# Patient Record
Sex: Female | Born: 2000 | Race: Black or African American | Hispanic: No | Marital: Single | Smoking: Never smoker
Health system: Southern US, Community
[De-identification: ages and names within clinical notes are randomized; demographics above are authoritative.]

## PROBLEM LIST (undated history)

## (undated) DIAGNOSIS — J45909 Unspecified asthma, uncomplicated: Secondary | ICD-10-CM

## (undated) HISTORY — PX: NO PAST SURGERIES: SHX2092

## (undated) HISTORY — DX: Unspecified asthma, uncomplicated: J45.909

---
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
## 2019-01-06 ENCOUNTER — Other Ambulatory Visit: Payer: Self-pay

## 2019-01-06 NOTE — Progress Notes (Deleted)
GYNECOLOGY  VISIT   HPI: 18 y.o.   Single Black or African American Not Hispanic or Latino  female   G0P0000 with No LMP recorded.   here for     GYNECOLOGIC HISTORY: No LMP recorded. Contraception:*** Menopausal hormone therapy: ***        OB History    Gravida  0   Para  0   Term  0   Preterm  0   AB  0   Living  0     SAB  0   TAB  0   Ectopic  0   Multiple  0   Live Births  0              Patient Active Problem List   Diagnosis Date Noted  . Asthma     Past Medical History:  Diagnosis Date  . Asthma     No past surgical history on file.  Current Outpatient Medications  Medication Sig Dispense Refill  . albuterol (VENTOLIN HFA) 108 (90 Base) MCG/ACT inhaler Inhale 1 puff into the lungs as needed.    Colleen Can. JUNEL FE 1/20 1-20 MG-MCG tablet TAKE 1 TABLET BY MOUTH EVERY DAY 28 tablet 0  . TRI-LEGEST FE 1-20/1-30/1-35 MG-MCG tablet Take 1 tablet by mouth daily.     No current facility-administered medications for this visit.      ALLERGIES: Patient has no allergy information on record.  Family History  Problem Relation Age of Onset  . Thyroid disease Mother   . Breast cancer Paternal Grandmother     Social History   Socioeconomic History  . Marital status: Single    Spouse name: Not on file  . Number of children: Not on file  . Years of education: Not on file  . Highest education level: Not on file  Occupational History  . Not on file  Social Needs  . Financial resource strain: Not on file  . Food insecurity    Worry: Not on file    Inability: Not on file  . Transportation needs    Medical: Not on file    Non-medical: Not on file  Tobacco Use  . Smoking status: Never Smoker  . Smokeless tobacco: Never Used  Substance and Sexual Activity  . Alcohol use: Never    Frequency: Never  . Drug use: Never  . Sexual activity: Not Currently    Birth control/protection: Pill  Lifestyle  . Physical activity    Days per week: Not on file     Minutes per session: Not on file  . Stress: Not on file  Relationships  . Social Musicianconnections    Talks on phone: Not on file    Gets together: Not on file    Attends religious service: Not on file    Active member of club or organization: Not on file    Attends meetings of clubs or organizations: Not on file    Relationship status: Not on file  . Intimate partner violence    Fear of current or ex partner: Not on file    Emotionally abused: Not on file    Physically abused: Not on file    Forced sexual activity: Not on file  Other Topics Concern  . Not on file  Social History Narrative  . Not on file    ROS  PHYSICAL EXAMINATION:    There were no vitals taken for this visit.    General appearance: alert, cooperative and appears stated age  Neck: no adenopathy, supple, symmetrical, trachea midline and thyroid {CHL AMB PHY EX THYROID NORM DEFAULT:[phone]::"normal to inspection and palpation"} Breasts: {Exam; breast:13139::"normal appearance, no masses or tenderness"} Abdomen: soft, non-tender; non distended, no masses,  no organomegaly  Pelvic: External genitalia:  no lesions              Urethra:  normal appearing urethra with no masses, tenderness or lesions              Bartholins and Skenes: normal                 Vagina: normal appearing vagina with normal color and discharge, no lesions              Cervix: {CHL AMB PHY EX CERVIX NORM DEFAULT:[phone]::"no lesions"}              Bimanual Exam:  Uterus:  {CHL AMB PHY EX UTERUS NORM DEFAULT:[phone]::"normal size, contour, position, consistency, mobility, non-tender"}              Adnexa: {CHL AMB PHY EX ADNEXA NO MASS [phone]::"no mass, fullness, tenderness"}              Rectovaginal: {yes no:[id number]}.  Confirms.              Anus:  normal sphincter tone, no lesions  Chaperone was present for exam.  ASSESSMENT     PLAN    An After Visit Summary was printed and given to the patient.  ***  minutes face to face time of which over 50% was spent in counseling.

## 2019-01-06 NOTE — Progress Notes (Signed)
GYNECOLOGY  VISIT   HPI: 18 y.o.   Single Black or African American Not Hispanic or Latino  female   G0P0000 with Patient's last menstrual period was 12/24/2018.   here for 3 month OCP check. She was started on OCP's in 5/20 at her annual exam for cycle control. She had postpill amenorrhea previously. On the pill her cycle is monthly x 5-7 days, changes a pad in up to 1.5-2 hours (not saturated). Cramps can be bad, she uses a heating pad, she sometime uses OTC medication which helps some.    GYNECOLOGIC HISTORY: Patient's last menstrual period was 12/24/2018. Contraception:JUNEL FE Menopausal hormone therapy: n/a        OB History    Gravida  0   Para  0   Term  0   Preterm  0   AB  0   Living  0     SAB  0   TAB  0   Ectopic  0   Multiple  0   Live Births  0              Patient Active Problem List   Diagnosis Date Noted  . Asthma     Past Medical History:  Diagnosis Date  . Asthma     History reviewed. No pertinent surgical history.  Current Outpatient Medications  Medication Sig Dispense Refill  . Adapalene-Benzoyl Peroxide (EPIDUO FORTE) 0.3-2.5 % GEL Epiduo Forte 0.3 %-2.5 % topical gel with pump    . albuterol (VENTOLIN HFA) 108 (90 Base) MCG/ACT inhaler Inhale 1 puff into the lungs as needed.    Colleen Can. JUNEL FE 1/20 1-20 MG-MCG tablet TAKE 1 TABLET BY MOUTH EVERY DAY 28 tablet 0   No current facility-administered medications for this visit.      ALLERGIES: Patient has no known allergies.  Family History  Problem Relation Age of Onset  . Thyroid disease Mother   . Breast cancer Paternal Grandmother     Social History   Socioeconomic History  . Marital status: Single    Spouse name: Not on file  . Number of children: Not on file  . Years of education: Not on file  . Highest education level: Not on file  Occupational History  . Not on file  Social Needs  . Financial resource strain: Not on file  . Food insecurity    Worry: Not on file     Inability: Not on file  . Transportation needs    Medical: Not on file    Non-medical: Not on file  Tobacco Use  . Smoking status: Never Smoker  . Smokeless tobacco: Never Used  Substance and Sexual Activity  . Alcohol use: Never    Frequency: Never  . Drug use: Never  . Sexual activity: Not Currently    Birth control/protection: Pill  Lifestyle  . Physical activity    Days per week: Not on file    Minutes per session: Not on file  . Stress: Not on file  Relationships  . Social Musicianconnections    Talks on phone: Not on file    Gets together: Not on file    Attends religious service: Not on file    Active member of club or organization: Not on file    Attends meetings of clubs or organizations: Not on file    Relationship status: Not on file  . Intimate partner violence    Fear of current or ex partner: Not on file  Emotionally abused: Not on file    Physically abused: Not on file    Forced sexual activity: Not on file  Other Topics Concern  . Not on file  Social History Narrative  . Not on file    Review of Systems  Constitutional: Negative.   HENT: Negative.   Eyes: Negative.   Respiratory: Negative.   Cardiovascular: Negative.   Gastrointestinal: Negative.   Genitourinary: Negative.   Musculoskeletal: Negative.   Skin: Negative.   Neurological: Negative.   Endo/Heme/Allergies: Negative.   Psychiatric/Behavioral: Negative.     PHYSICAL EXAMINATION:    BP 112/70 (BP Location: Right Arm, Patient Position: Sitting, Cuff Size: Large)   Pulse 76   Temp (!) 97.2 F (36.2 C) (Temporal)   Resp 14   Wt 230 lb (104.3 kg)   LMP 12/24/2018   BMI 34.86 kg/m     General appearance: alert, cooperative and appears stated age  ASSESSMENT Contraception surveillance, doing well on this pill H/O post pill amenorrhea earlier this year, long h/o oligomenorrhea Dysmenorrhea      PLAN Continue OCP's Blood work was supposed to be done at her annual, but wasn't  done TSH/prolactin, screening labs, blood work for STD testing (negative cervical cultures) Declines prescription Ibuprofen, will use OTC medication as needed F/U for annual exam in 6/21   An After Visit Summary was printed and given to the patient.

## 2019-01-08 ENCOUNTER — Ambulatory Visit: Payer: 59 | Admitting: Obstetrics and Gynecology

## 2019-01-08 ENCOUNTER — Other Ambulatory Visit: Payer: Self-pay

## 2019-01-08 ENCOUNTER — Encounter: Payer: Self-pay | Admitting: Obstetrics and Gynecology

## 2019-01-08 VITALS — BP 112/70 | HR 76 | Temp 97.2°F | Resp 14 | Wt 230.0 lb

## 2019-01-08 DIAGNOSIS — Z8742 Personal history of other diseases of the female genital tract: Secondary | ICD-10-CM | POA: Diagnosis not present

## 2019-01-08 DIAGNOSIS — Z0000 Encounter for general adult medical examination without abnormal findings: Secondary | ICD-10-CM

## 2019-01-08 DIAGNOSIS — Z3041 Encounter for surveillance of contraceptive pills: Secondary | ICD-10-CM | POA: Diagnosis not present

## 2019-01-08 DIAGNOSIS — Z113 Encounter for screening for infections with a predominantly sexual mode of transmission: Secondary | ICD-10-CM | POA: Diagnosis not present

## 2019-01-08 DIAGNOSIS — N946 Dysmenorrhea, unspecified: Secondary | ICD-10-CM

## 2019-01-08 MED ORDER — NORETHIN ACE-ETH ESTRAD-FE 1-20 MG-MCG PO TABS: 1.0000 | ORAL_TABLET | Freq: Every day | ORAL | 2 refills | Status: DC

## 2019-01-09 LAB — COMPREHENSIVE METABOLIC PANEL
ALT: 12 IU/L (ref 0–32)
AST: 10 IU/L (ref 0–40)
Albumin/Globulin Ratio: 1.6 (ref 1.2–2.2)
Albumin: 3.9 g/dL (ref 3.9–5.0)
Alkaline Phosphatase: 44 IU/L (ref 39–117)
BUN/Creatinine Ratio: 20 (ref 9–23)
BUN: 13 mg/dL (ref 6–20)
Bilirubin Total: 0.2 mg/dL (ref 0.0–1.2)
CO2: 23 mmol/L (ref 20–29)
Calcium: 9 mg/dL (ref 8.7–10.2)
Chloride: 104 mmol/L (ref 96–106)
Creatinine, Ser: 0.66 mg/dL (ref 0.57–1.00)
GFR calc Af Amer: 148 mL/min/{1.73_m2} (ref >59)
GFR calc non Af Amer: 128 mL/min/{1.73_m2} (ref >59)
Globulin, Total: 2.5 g/dL (ref 1.5–4.5)
Glucose: 79 mg/dL (ref 65–99)
Potassium: 4.1 mmol/L (ref 3.5–5.2)
Sodium: 141 mmol/L (ref 134–144)
Total Protein: 6.4 g/dL (ref 6.0–8.5)

## 2019-01-09 LAB — CBC
Hematocrit: 34.4 % (ref 34.0–46.6)
Hemoglobin: 11 g/dL — ABNORMAL LOW (ref 11.1–15.9)
MCH: 29.6 pg (ref 26.6–33.0)
MCHC: 32 g/dL (ref 31.5–35.7)
MCV: 93 fL (ref 79–97)
Platelets: 220 10*3/uL (ref 150–450)
RBC: 3.72 x10E6/uL — ABNORMAL LOW (ref 3.77–5.28)
RDW: 12.1 % (ref 11.7–15.4)
WBC: 3.8 10*3/uL (ref 3.4–10.8)

## 2019-01-09 LAB — HIV ANTIBODY (ROUTINE TESTING W REFLEX): HIV Screen 4th Generation wRfx: NONREACTIVE

## 2019-01-09 LAB — PROLACTIN: Prolactin: 12.1 ng/mL (ref 4.8–23.3)

## 2019-01-09 LAB — RPR: RPR Ser Ql: NONREACTIVE

## 2019-01-09 LAB — LIPID PANEL
Chol/HDL Ratio: 2.3 ratio (ref 0.0–4.4)
Cholesterol, Total: 147 mg/dL (ref 100–169)
HDL: 64 mg/dL (ref >39)
LDL Calculated: 76 mg/dL (ref 0–109)
Triglycerides: 33 mg/dL (ref 0–89)
VLDL Cholesterol Cal: 7 mg/dL (ref 5–40)

## 2019-01-09 LAB — TSH: TSH: 2.24 u[IU]/mL (ref 0.450–4.500)

## 2019-01-12 ENCOUNTER — Other Ambulatory Visit: Payer: Self-pay | Admitting: Obstetrics and Gynecology

## 2019-01-12 DIAGNOSIS — Z862 Personal history of diseases of the blood and blood-forming organs and certain disorders involving the immune mechanism: Secondary | ICD-10-CM

## 2019-03-20 ENCOUNTER — Other Ambulatory Visit: Payer: Self-pay | Admitting: Family Medicine

## 2019-03-20 DIAGNOSIS — N6310 Unspecified lump in the right breast, unspecified quadrant: Secondary | ICD-10-CM

## 2019-03-25 ENCOUNTER — Other Ambulatory Visit: Payer: 59

## 2019-03-26 ENCOUNTER — Ambulatory Visit
Admission: RE | Admit: 2019-03-26 | Discharge: 2019-03-26 | Disposition: A | Discharge status: Home / Self Care | Payer: 59 | Source: Ambulatory Visit | Attending: Family Medicine | Admitting: Family Medicine

## 2019-03-26 ENCOUNTER — Other Ambulatory Visit: Payer: Self-pay

## 2019-03-26 DIAGNOSIS — N6310 Unspecified lump in the right breast, unspecified quadrant: Secondary | ICD-10-CM

## 2019-04-13 NOTE — Progress Notes (Deleted)
Patient in today for 3rd Gardasil injection.   Contraception: Depo LMP: *** Last AEX: 10/08/18 with Dr. Talbert Nan  Injection given in Right Deltoid. Patient tolerated shot well.   Patient informed she is finished with the series.

## 2019-04-16 ENCOUNTER — Ambulatory Visit: Payer: 59

## 2019-04-20 ENCOUNTER — Ambulatory Visit: Payer: 59

## 2019-04-20 NOTE — Progress Notes (Deleted)
Patient in today for *** Gardasil injection.   Contraception: *** LMP: *** Last AEX: *** with ***  Injection given in ***. Patient tolerated shot well.   Patient informed next injection due in about *** months.  Advised patient, if not on birth control, to return for next injection with cycle.   Routed to provider for final review.  Encounter closed.

## 2019-10-02 ENCOUNTER — Other Ambulatory Visit: Payer: Self-pay | Admitting: Obstetrics and Gynecology

## 2019-10-02 NOTE — Telephone Encounter (Signed)
Medication refill request: Junel  Last AEX:  10-08-2018 JJ  Next AEX: 10-14-19  Last MMG (if hormonal medication request): n/a Refill authorized: Today, please advise.   Medication pended for #28, 0RF. Please refill if appropriate.

## 2019-10-08 NOTE — Progress Notes (Signed)
19 y.o. G0P0000 Single Black or African American Not Hispanic or Latino female here for annual exam.      She was started on OCP's in 5/20 for cycle control. H/O oligomenorrhea (normal TSH/prolactin). Cycles are monthly x 5-6 days. Can saturate a pad in up to an 1.5 hours at times. Heavy for a couple of days.   Sexually active, new partner x 2 months. Not using condoms, no dyspareunia.   She was mildly anemic last year with a hgb of 11.  Patient's last menstrual period was 09/28/2019.          Sexually active: Yes.    The current method of family planning is JUNEL FE.    Exercising: No.  The patient does not participate in regular exercise at present. Smoker:  no  Health Maintenance: Pap:  N/A History of abnormal Pap:  NA TDaP:  unsure Gardasil: completed x2    reports that she has never smoked. She has never used smokeless tobacco. She reports that she does not drink alcohol or use drugs. She just finished her second year at [employer]. She is studying business. She has 4 older brothers.   Past Medical History:  Diagnosis Date  . Asthma     History reviewed. No pertinent surgical history.  Current Outpatient Medications  Medication Sig Dispense Refill  . albuterol (VENTOLIN HFA) 108 (90 Base) MCG/ACT inhaler Inhale 1 puff into the lungs as needed.    Lenda Kelp FE 1/20 1-20 MG-MCG tablet TAKE 1 TABLET BY MOUTH EVERY DAY 28 tablet 0   No current facility-administered medications for this visit.    Family History  Problem Relation Age of Onset  . Thyroid disease Mother   . Breast cancer Paternal Grandmother     Review of Systems  Constitutional: Negative.   HENT: Negative.   Eyes: Negative.   Respiratory: Negative.   Cardiovascular: Negative.   Gastrointestinal: Negative.   Endocrine: Negative.   Genitourinary: Negative.   Musculoskeletal: Negative.   Skin: Negative.   Allergic/Immunologic: Negative.   Neurological: Negative.   Hematological: Negative.    Psychiatric/Behavioral: Negative.     Exam:   BP (!) 110/58 (BP Location: Right Arm, Patient Position: Sitting, Cuff Size: Large)   Pulse 68   Temp (!) 97.3 F (36.3 C) (Temporal)   Ht 5' 8.25" (1.734 m)   Wt 211 lb (95.7 kg)   LMP 09/28/2019   BMI 31.85 kg/m   Weight change: @WEIGHTCHANGE @ Height:   Height: 5' 8.25" (173.4 cm)  Ht Readings from Last 3 Encounters:  10/14/19 5' 8.25" (1.734 m) (94 %, Z= 1.55)*  10/08/18 5' 8.11" (1.73 m) (93 %, Z= 1.51)*   * Growth percentiles are based on CDC (Girls, 2-20 Years) data.    General appearance: alert, cooperative and appears stated age Head: Normocephalic, without obvious abnormality, atraumatic Neck: no adenopathy, supple, symmetrical, trachea midline and thyroid normal to inspection and palpation Lungs: clear to auscultation bilaterally Cardiovascular: regular rate and rhythm Breasts: normal appearance, no masses or tenderness Abdomen: soft, non-tender; non distended,  no masses,  no organomegaly Extremities: extremities normal, atraumatic, no cyanosis or edema Skin: Skin color, texture, turgor normal. No rashes or lesions Lymph nodes: Cervical, supraclavicular, and axillary nodes normal. No abnormal inguinal nodes palpated Neurologic: Grossly normal   Pelvic: External genitalia:  no lesions              Urethra:  normal appearing urethra with no masses, tenderness or lesions  Bartholins and Skenes: normal                 Vagina: normal appearing vagina with normal color and discharge, no lesions              Cervix: no lesions               Bimanual Exam:  Uterus:  normal size, contour, position, consistency, mobility, non-tender and anteverted              Adnexa: no mass, fullness, tenderness               Rectovaginal: deferred  Carolynn Serve chaperoned for the exam.  A:  Well Woman with normal exam  Menorrhagia on OCP's, normal exam  H/O anemia  H/O asthma (rare), refill for albuterol sent  P:   No  pap until 20  Screening labs, ferritin  Screening STD's  3rd gardasil today  Continue OCP's  Condom use encouraged  If she is anemic, I recommended she return for an ultrasound to r/o a small myoma

## 2019-10-13 ENCOUNTER — Other Ambulatory Visit: Payer: Self-pay

## 2019-10-14 ENCOUNTER — Encounter: Payer: Self-pay | Admitting: Obstetrics and Gynecology

## 2019-10-14 ENCOUNTER — Ambulatory Visit: Payer: No Typology Code available for payment source | Admitting: Obstetrics and Gynecology

## 2019-10-14 VITALS — BP 110/58 | HR 68 | Temp 97.3°F | Ht 68.25 in | Wt 211.0 lb

## 2019-10-14 DIAGNOSIS — Z23 Encounter for immunization: Secondary | ICD-10-CM

## 2019-10-14 DIAGNOSIS — Z0000 Encounter for general adult medical examination without abnormal findings: Secondary | ICD-10-CM | POA: Diagnosis not present

## 2019-10-14 DIAGNOSIS — Z01419 Encounter for gynecological examination (general) (routine) without abnormal findings: Secondary | ICD-10-CM | POA: Diagnosis not present

## 2019-10-14 DIAGNOSIS — Z862 Personal history of diseases of the blood and blood-forming organs and certain disorders involving the immune mechanism: Secondary | ICD-10-CM | POA: Diagnosis not present

## 2019-10-14 DIAGNOSIS — Z113 Encounter for screening for infections with a predominantly sexual mode of transmission: Secondary | ICD-10-CM

## 2019-10-14 DIAGNOSIS — Z3041 Encounter for surveillance of contraceptive pills: Secondary | ICD-10-CM

## 2019-10-14 MED ORDER — ALBUTEROL SULFATE HFA 108 (90 BASE) MCG/ACT IN AERS: 1.0000 | INHALATION_SPRAY | RESPIRATORY_TRACT | 1 refills | Status: DC | PRN

## 2019-10-14 MED ORDER — NORETHIN ACE-ETH ESTRAD-FE 1-20 MG-MCG PO TABS: 1.0000 | ORAL_TABLET | Freq: Every day | ORAL | 3 refills | Status: DC

## 2019-10-14 NOTE — Patient Instructions (Signed)
EXERCISE AND DIET:  We recommended that you start or continue a regular exercise program for good health. Regular exercise means any activity that makes your heart beat faster and makes you sweat.  We recommend exercising at least 30 minutes per day at least 3 days a week, preferably 4 or 5.  We also recommend a diet low in fat and sugar.  Inactivity, poor dietary choices and obesity can cause diabetes, heart attack, stroke, and kidney damage, among others.    ALCOHOL AND SMOKING:  Women should limit their alcohol intake to no more than 7 drinks/beers/glasses of wine (combined, not each!) per week. Moderation of alcohol intake to this level decreases your risk of breast cancer and liver damage. And of course, no recreational drugs are part of a healthy lifestyle.  And absolutely no smoking or even second hand smoke. Most people know smoking can cause heart and lung diseases, but did you know it also contributes to weakening of your bones? Aging of your skin?  Yellowing of your teeth and nails?  CALCIUM AND VITAMIN D:  Adequate intake of calcium and Vitamin D are recommended.  The recommendations for exact amounts of these supplements seem to change often, but generally speaking 1,000 mg of calcium (between diet and supplement) and 800 units of Vitamin D per day seems prudent. Certain women may benefit from higher intake of Vitamin D.  If you are among these women, your doctor will have told you during your visit.    PAP SMEARS:  Pap smears, to check for cervical cancer or precancers,  have traditionally been done yearly, although recent scientific advances have shown that most women can have pap smears less often.  However, every woman still should have a physical exam from her gynecologist every year. It will include a breast check, inspection of the vulva and vagina to check for abnormal growths or skin changes, a visual exam of the cervix, and then an exam to evaluate the size and shape of the uterus and  ovaries.  And after 20 years of age, a rectal exam is indicated to check for rectal cancers. We will also provide age appropriate advice regarding health maintenance, like when you should have certain vaccines, screening for sexually transmitted diseases, bone density testing, colonoscopy, mammograms, etc.   MAMMOGRAMS:  All women over 40 years old should have a yearly mammogram. Many facilities now offer a "3D" mammogram, which may cost around $50 extra out of pocket. If possible,  we recommend you accept the option to have the 3D mammogram performed.  It both reduces the number of women who will be called back for extra views which then turn out to be normal, and it is better than the routine mammogram at detecting truly abnormal areas.    COLON CANCER SCREENING: Now recommend starting at age 45. At this time colonoscopy is not covered for routine screening until 50. There are take home tests that can be done between 45-49.   COLONOSCOPY:  Colonoscopy to screen for colon cancer is recommended for all women at age 50.  We know, you hate the idea of the prep.  We agree, BUT, having colon cancer and not knowing it is worse!!  Colon cancer so often starts as a polyp that can be seen and removed at colonscopy, which can quite literally save your life!  And if your first colonoscopy is normal and you have no family history of colon cancer, most women don't have to have it again for   10 years.  Once every ten years, you can do something that may end up saving your life, right?  We will be happy to help you get it scheduled when you are ready.  Be sure to check your insurance coverage so you understand how much it will cost.  It may be covered as a preventative service at no cost, but you should check your particular policy.      Breast Self-Awareness Breast self-awareness means being familiar with how your breasts look and feel. It involves checking your breasts regularly and reporting any changes to your  health care provider. Practicing breast self-awareness is important. A change in your breasts can be a sign of a serious medical problem. Being familiar with how your breasts look and feel allows you to find any problems early, when treatment is more likely to be successful. All women should practice breast self-awareness, including women who have had breast implants. How to do a breast self-exam One way to learn what is normal for your breasts and whether your breasts are changing is to do a breast self-exam. To do a breast self-exam: Look for Changes  1. Remove all the clothing above your waist. 2. Stand in front of a mirror in a room with good lighting. 3. Put your hands on your hips. 4. Push your hands firmly downward. 5. Compare your breasts in the mirror. Look for differences between them (asymmetry), such as: ? Differences in shape. ? Differences in size. ? Puckers, dips, and bumps in one breast and not the other. 6. Look at each breast for changes in your skin, such as: ? Redness. ? Scaly areas. 7. Look for changes in your nipples, such as: ? Discharge. ? Bleeding. ? Dimpling. ? Redness. ? A change in position. Feel for Changes Carefully feel your breasts for lumps and changes. It is best to do this while lying on your back on the floor and again while sitting or standing in the shower or tub with soapy water on your skin. Feel each breast in the following way:  Place the arm on the side of the breast you are examining above your head.  Feel your breast with the other hand.  Start in the nipple area and make  inch (2 cm) overlapping circles to feel your breast. Use the pads of your three middle fingers to do this. Apply light pressure, then medium pressure, then firm pressure. The light pressure will allow you to feel the tissue closest to the skin. The medium pressure will allow you to feel the tissue that is a little deeper. The firm pressure will allow you to feel the tissue  close to the ribs.  Continue the overlapping circles, moving downward over the breast until you feel your ribs below your breast.  Move one finger-width toward the center of the body. Continue to use the  inch (2 cm) overlapping circles to feel your breast as you move slowly up toward your collarbone.  Continue the up and down exam using all three pressures until you reach your armpit.  Write Down What You Find  Write down what is normal for each breast and any changes that you find. Keep a written record with breast changes or normal findings for each breast. By writing this information down, you do not need to depend only on memory for size, tenderness, or location. Write down where you are in your menstrual cycle, if you are still menstruating. If you are having trouble noticing differences   in your breasts, do not get discouraged. With time you will become more familiar with the variations in your breasts and more comfortable with the exam. How often should I examine my breasts? Examine your breasts every month. If you are breastfeeding, the best time to examine your breasts is after a feeding or after using a breast pump. If you menstruate, the best time to examine your breasts is 5-7 days after your period is over. During your period, your breasts are lumpier, and it may be more difficult to notice changes. When should I see my health care provider? See your health care provider if you notice:  A change in shape or size of your breasts or nipples.  A change in the skin of your breast or nipples, such as a reddened or scaly area.  Unusual discharge from your nipples.  A lump or thick area that was not there before.  Pain in your breasts.  Anything that concerns you.  

## 2019-10-15 LAB — COMPREHENSIVE METABOLIC PANEL
ALT: 6 IU/L (ref 0–32)
AST: 9 IU/L (ref 0–40)
Albumin/Globulin Ratio: 1.6 (ref 1.2–2.2)
Albumin: 4 g/dL (ref 3.9–5.0)
Alkaline Phosphatase: 51 IU/L (ref 45–106)
BUN/Creatinine Ratio: 19 (ref 9–23)
BUN: 12 mg/dL (ref 6–20)
Bilirubin Total: 0.3 mg/dL (ref 0.0–1.2)
CO2: 24 mmol/L (ref 20–29)
Calcium: 9.1 mg/dL (ref 8.7–10.2)
Chloride: 102 mmol/L (ref 96–106)
Creatinine, Ser: 0.62 mg/dL (ref 0.57–1.00)
GFR calc Af Amer: 151 mL/min/{1.73_m2} (ref >59)
GFR calc non Af Amer: 131 mL/min/{1.73_m2} (ref >59)
Globulin, Total: 2.5 g/dL (ref 1.5–4.5)
Glucose: 81 mg/dL (ref 65–99)
Potassium: 4 mmol/L (ref 3.5–5.2)
Sodium: 141 mmol/L (ref 134–144)
Total Protein: 6.5 g/dL (ref 6.0–8.5)

## 2019-10-15 LAB — CBC
Hematocrit: 37.4 % (ref 34.0–46.6)
Hemoglobin: 11.7 g/dL (ref 11.1–15.9)
MCH: 29 pg (ref 26.6–33.0)
MCHC: 31.3 g/dL — ABNORMAL LOW (ref 31.5–35.7)
MCV: 93 fL (ref 79–97)
Platelets: 261 10*3/uL (ref 150–450)
RBC: 4.04 x10E6/uL (ref 3.77–5.28)
RDW: 12.1 % (ref 11.7–15.4)
WBC: 3.7 10*3/uL (ref 3.4–10.8)

## 2019-10-15 LAB — LIPID PANEL
Chol/HDL Ratio: 2.6 ratio (ref 0.0–4.4)
Cholesterol, Total: 139 mg/dL (ref 100–169)
HDL: 54 mg/dL (ref >39)
LDL Chol Calc (NIH): 74 mg/dL (ref 0–109)
Triglycerides: 51 mg/dL (ref 0–89)
VLDL Cholesterol Cal: 11 mg/dL (ref 5–40)

## 2019-10-15 LAB — FERRITIN: Ferritin: 102 ng/mL — ABNORMAL HIGH (ref 15–77)

## 2019-10-15 LAB — RPR: RPR Ser Ql: NONREACTIVE

## 2019-10-15 LAB — HIV ANTIBODY (ROUTINE TESTING W REFLEX): HIV Screen 4th Generation wRfx: NONREACTIVE

## 2019-10-16 LAB — CHLAMYDIA/GONOCOCCUS/TRICHOMONAS, NAA
Chlamydia by NAA: NEGATIVE
Gonococcus by NAA: NEGATIVE
Trich vag by NAA: NEGATIVE

## 2019-10-25 ENCOUNTER — Other Ambulatory Visit: Payer: Self-pay | Admitting: Obstetrics and Gynecology

## 2019-10-26 ENCOUNTER — Telehealth: Payer: Self-pay | Admitting: Obstetrics and Gynecology

## 2019-10-26 NOTE — Telephone Encounter (Signed)
Patient requests refills of her birth control to [facility], [place]. She said the pharmacy told her Dr.Jertson denied the refill request. She said  "Dr.Jertson told me she was sending in refills at my last aex 10/14/19". She said "I was only given 1 refill to hold me over until my aex 10/14/19".

## 2019-10-26 NOTE — Telephone Encounter (Signed)
Spoke with patient and advised that prescription was indeed sent 10/14/19 to pharmacy. Advised patient that what was refused was a duplicate request. Patient states that she will go to pharmacy to pick up prescription. Patient verbalizes understanding and is agreeable. Closing encounter.

## 2020-02-02 ENCOUNTER — Telehealth: Payer: Self-pay

## 2020-02-02 NOTE — Telephone Encounter (Signed)
Patient states she is having cramping that may be due to birth control. Patient would like to come in for check.

## 2020-02-02 NOTE — Telephone Encounter (Signed)
AEX 10/2019 Started OCP 09/2018 for cycle control.   Spoke with pt. Pt states having pelvic/abd cramps, heavy bleeding and clots and slight nausea intermittently while taking OCPs during cycles. Pt states was concerned and saw PCP last week. States had negative STD testing and vaginitis testing. Denies vaginal discharge, odor and irritation. Pt states has only had these episodes the last 2 cycles. States is having monthly cycles, denies BTB.   Pt advised to have follow up with Dr Oscar La. Pt agreeable. No appts available this week on schedule.  Pt aware of work-in to schedule. Pt scheduled for 9/22 at 145 pm. Pt agreeable and verbalized understanding to date and time of appt.   Encounter closed.

## 2020-02-03 ENCOUNTER — Other Ambulatory Visit: Payer: Self-pay

## 2020-02-03 ENCOUNTER — Ambulatory Visit: Payer: No Typology Code available for payment source | Admitting: Obstetrics and Gynecology

## 2020-02-03 ENCOUNTER — Encounter: Payer: Self-pay | Admitting: Obstetrics and Gynecology

## 2020-02-03 VITALS — BP 110/62 | HR 101 | Ht 68.25 in | Wt 202.4 lb

## 2020-02-03 DIAGNOSIS — N946 Dysmenorrhea, unspecified: Secondary | ICD-10-CM | POA: Diagnosis not present

## 2020-02-03 DIAGNOSIS — R102 Pelvic and perineal pain: Secondary | ICD-10-CM

## 2020-02-03 DIAGNOSIS — K5900 Constipation, unspecified: Secondary | ICD-10-CM

## 2020-02-03 MED ORDER — NAPROXEN SODIUM 550 MG PO TABS
ORAL_TABLET | ORAL | 2 refills | Status: DC
Start: 2020-02-03 — End: 2020-11-26

## 2020-02-03 NOTE — Patient Instructions (Signed)
About Constipation  Constipation Overview Constipation is the most common gastrointestinal complaint -- about 4 million Americans experience constipation and make 2.5 million physician visits a year to get help for the problem.  Constipation can occur when the colon absorbs too much water, the colon's muscle contraction is slow or sluggish, and/or there is delayed transit time through the colon.  The result is stool that is hard and dry.  Indicators of constipation include straining during bowel movements greater than 25% of the time, having fewer than three bowel movements per week, and/or the feeling of incomplete evacuation.  There are established guidelines ([place] II ) for defining constipation. A person needs to have two or more of the following symptoms for at least 12 weeks (not necessarily consecutive) in the preceding 12 months: . Straining in  greater than 25% of bowel movements . Lumpy or hard stools in greater than 25% of bowel movements . Sensation of incomplete emptying in greater than 25% of bowel movements . Sensation of anorectal obstruction/blockade in greater than 25% of bowel movements . Manual maneuvers to help empty greater than 25% of bowel movements (e.g., digital evacuation, support of the pelvic floor)  . Less than  3 bowel movements/week . Loose stools are not present, and criteria for irritable bowel syndrome are insufficient  Common Causes of Constipation . Lack of fiber in your diet . Lack of physical activity . Medications, including iron and calcium supplements  . Dairy intake . Dehydration . Abuse of laxatives  Travel  Irritable Bowel Syndrome  Pregnancy  Luteal phase of menstruation (after ovulation and before menses)  Colorectal problems  Intestinal Dysfunction  Treating Constipation  There are several ways of treating constipation, including changes to diet and exercise, use of laxatives, adjustments to the pelvic floor, and scheduled toileting.   These treatments include: . increasing fiber and fluids in the diet  . increasing physical activity . learning muscle coordination   learning proper toileting techniques and toileting modifications   designing and sticking  to a toileting schedule     2007, [employer] Doc.22Dysmenorrhea  Dysmenorrhea refers to cramps caused by the muscles of the uterus tightening (contracting) during a menstrual period. Dysmenorrhea may be mild, or it may be severe enough to interfere with everyday activities for a few days each month. Primary dysmenorrhea is menstrual cramps that last a couple of days when you start having menstrual periods or soon after. This often begins after a teenager starts having her period. As a woman gets older or has a baby, the cramps will usually lessen or disappear. Secondary dysmenorrhea begins later in life and is caused by a disorder in the reproductive system. It lasts longer, and it may cause more pain than primary dysmenorrhea. The pain may start before the period and last a few days after the period. What are the causes? Dysmenorrhea is usually caused by an underlying problem, such as:  The tissue that lines the uterus (endometrium) growing outside of the uterus in other areas of the body (endometriosis).  Endometrial tissue growing into the muscular walls of the uterus (adenomyosis).  Blood vessels in the pelvis becoming filled with blood just before the menstrual period (pelvic congestive syndrome).  Overgrowth of cells (polyps) in the endometrium or the lower part of the uterus (cervix).  The uterus dropping down into the vagina (prolapse) due to stretched or weak muscles.  Bladder problems, such as infection or inflammation.  Intestinal problems, such as a tumor or irritable bowel  syndrome.  Cancer of the reproductive organs or bladder.  A severely tipped uterus.  A cervix that is closed or has a very small opening.  Noncancerous (benign)  tumors of the uterus (fibroids).  Pelvic inflammatory disease (PID).  Pelvic scarring (adhesions) from a previous surgery.  An ovarian cyst.  An IUD (intrauterine device). What increases the risk? You are more likely to develop this condition if:  You are younger than age 60.  You started puberty early.  You have irregular or heavy bleeding.  You have never given birth.  You have a family history of dysmenorrhea.  You smoke. What are the signs or symptoms? Symptoms of this condition include:  Cramping, throbbing pain, or a feeling of fullness in the lower abdomen.  Lower back pain.  Periods lasting for longer than 7 days.  Headaches.  Bloating.  Fatigue.  Nausea or vomiting.  Diarrhea.  Sweating or dizziness.  Loose stools. How is this diagnosed? This condition may be diagnosed based on:  Your symptoms.  Your medical history.  A physical exam.  Blood tests.  A Pap test. This is a test in which cells from the cervix are tested for signs of cancer or infection.  A pregnancy test.  Imaging tests, such as: ? Ultrasound. ? A procedure to remove and examine a sample of endometrial tissue (dilation and curettage, D&C). ? A procedure to visually examine the inside of:  The uterus (hysteroscopy).  The abdomen or pelvis (laparoscopy).  The bladder (cystoscopy).  The intestine (colonoscopy).  The stomach (gastroscopy). ? X-rays. ? CT scan. ? MRI. How is this treated? Treatment depends on the cause of the dysmenorrhea. Treatment may include:  Pain medicine prescribed by your health care provider.  Birth control pills that contain the hormone progesterone.  An IUD that contains the hormone progesterone.  Medicines to control bleeding.  Hormone replacement therapy.  NSAIDs. These may help to stop the production of hormones that cause cramps.  Antidepressant medicines.  Surgery to remove adhesions, endometriosis, ovarian cysts, fibroids,  or the entire uterus (hysterectomy).  Injections of progesterone to stop the menstrual period.  A procedure to destroy the endometrium (endometrial ablation).  A procedure to cut the nerves in the bottom of the spine (sacrum) that go to the reproductive organs (presacral neurectomy).  A procedure to apply an electric current to nerves in the sacrum (sacral nerve stimulation).  Exercise and physical therapy.  Meditation and yoga therapy.  Acupuncture. Work with your health care provider to determine what treatment or combination of treatments is best for you. Follow these instructions at home: Relieving pain and cramping  Apply heat to your lower back or abdomen when you experience pain or cramps. Use the heat source that your health care provider recommends, such as a moist heat pack or a heating pad. ? Place a towel between your skin and the heat source. ? Leave the heat on for 20-30 minutes. ? Remove the heat if your skin turns bright red. This is especially important if you are unable to feel pain, heat, or cold. You may have a greater risk of getting burned. ? Do not sleep with a heating pad on.  Do aerobic exercises, such as walking, swimming, or biking. This can help to relieve cramps.  Massage your lower back or abdomen to help relieve pain. General instructions  Take over-the-counter and prescription medicines only as told by your health care provider.  Do not drive or use heavy machinery while taking prescription  pain medicine.  Avoid alcohol and caffeine during and right before your menstrual period. These can make cramps worse.  Do not use any products that contain nicotine or tobacco, such as cigarettes and e-cigarettes. If you need help quitting, ask your health care provider.  Keep all follow-up visits as told by your health care provider. This is important. Contact a health care provider if:  You have pain that gets worse or does not get better with  medicine.  You have pain with sex.  You develop nausea or vomiting with your period that is not controlled with medicine. Get help right away if:  You faint. Summary  Dysmenorrhea refers to cramps caused by the muscles of the uterus tightening (contracting) during a menstrual period.  Dysmenorrhea may be mild, or it may be severe enough to interfere with everyday activities for a few days each month.  Treatment depends on the cause of the dysmenorrhea.  Work with your health care provider to determine what treatment or combination of treatments is best for you. This information is not intended to replace advice given to you by your health care provider. Make sure you discuss any questions you have with your health care provider. Document Revised: 04/12/2017 Document Reviewed: 06/02/2016 Elsevier Patient Education  2020 [employer].

## 2020-02-03 NOTE — Progress Notes (Signed)
GYNECOLOGY  VISIT   HPI: 19 y.o.   Single Black or African American Not Hispanic or Latino  female   [id number] with No LMP recorded.   here for cramps with OCPs.  She says that when she starts her period she feels nauseated and has a headache and feels sick.  She was originally started on OCP's in 5/20 for cycle control (h/o oligomenorrhea). When she was seen for her annual exam in 6/21 she reported heavy cycles, saturating a pad in up to 1.5 hours. She wasn't anemic and her ferritin was elevated at 102.  She is getting some pelvic cramping the first week or so of starting a new pack in the last few months. She has intermittent constipation.   During her last 2 cycles her cramps have gotten worse and she is bleeding a little more. She can saturate a pad in up to 2 hours. Ibuprofen sometimes help, not always, heat and resting help some.  The last 2 cycles she has had nausea and headaches the first days of her cycle. The headaches are bilateral. No auras. Her symptoms all start at once. She spent one whole day of her cycle in bed because she felt so bad.  Cramps with her cycle are up to an 8/10, off her cycle a 6/10 in severity.   Sexually active, using condoms, same partner since June. She reports negative STD testing with her primary MD last week.   GYNECOLOGIC HISTORY: No LMP recorded. Contraception:OCP Menopausal hormone therapy: none        OB History    Gravida  0   Para  0   Term  0   Preterm  0   AB  0   Living  0     SAB  0   TAB  0   Ectopic  0   Multiple  0   Live Births  0              Patient Active Problem List   Diagnosis Date Noted   Asthma     Past Medical History:  Diagnosis Date   Asthma     No past surgical history on file.  Current Outpatient Medications  Medication Sig Dispense Refill   albuterol (VENTOLIN HFA) 108 (90 Base) MCG/ACT inhaler Inhale 1-2 puffs into the lungs every 4 (four) hours as needed. 8 g 1    norethindrone-ethinyl estradiol (JUNEL FE 1/20) 1-20 MG-MCG tablet Take 1 tablet by mouth daily. 84 tablet 3   No current facility-administered medications for this visit.     ALLERGIES: Patient has no known allergies.  Family History  Problem Relation Age of Onset   Thyroid disease Mother    Breast cancer Paternal Grandmother     Social History   Socioeconomic History   Marital status: Single    Spouse name: Not on file   Number of children: Not on file   Years of education: Not on file   Highest education level: Not on file  Occupational History   Not on file  Tobacco Use   Smoking status: Never Smoker   Smokeless tobacco: Never Used  Vaping Use   Vaping Use: Never used  Substance and Sexual Activity   Alcohol use: Never   Drug use: Never   Sexual activity: Not Currently    Birth control/protection: Pill  Other Topics Concern   Not on file  Social History Narrative   Not on file   Social Determinants of Health  Financial Resource Strain:    Difficulty of Paying Living Expenses: Not on file  Food Insecurity:    Worried About Programme researcher, broadcasting/film/video in the Last Year: Not on file   [employer] of Food in the Last Year: Not on file  Transportation Needs:    Lack of Transportation (Medical): Not on file   Lack of Transportation (Non-Medical): Not on file  Physical Activity:    Days of Exercise per Week: Not on file   Minutes of Exercise per Session: Not on file  Stress:    Feeling of Stress : Not on file  Social Connections:    Frequency of Communication with Friends and Family: Not on file   Frequency of Social Gatherings with Friends and Family: Not on file   Attends Religious Services: Not on file   Active Member of Clubs or Organizations: Not on file   Attends Banker Meetings: Not on file   Marital Status: Not on file  Intimate Partner Violence:    Fear of Current or Ex-Partner: Not on file   Emotionally Abused: Not  on file   Physically Abused: Not on file   Sexually Abused: Not on file    Review of Systems  All other systems reviewed and are negative.   PHYSICAL EXAMINATION:    There were no vitals taken for this visit.    General appearance: alert, cooperative and appears stated age Abdomen: soft, non-tender; non distended, no masses,  no organomegaly  Pelvic: External genitalia:  no lesions              Urethra:  normal appearing urethra with no masses, tenderness or lesions              Bartholins and Skenes: normal                 Vagina: normal appearing vagina with normal color and discharge, no lesions              Cervix: no cervical motion tenderness and no lesions              Bimanual Exam:  Uterus:  normal size, contour, position, consistency, mobility, non-tender and anteverted              Adnexa: no mass, fullness, tenderness              Pelvic floor: not tender  Chaperone was present for exam.  ASSESSMENT Recent worsening in cramps, on OCP's Some pelvic cramping in between cycles Intermittent constipaiton Negative STD testing last week with her primary    PLAN Take Anaprox with her cycles, start prior to the cramping starting Pay attention to when she is having the pelvic cramping and if she is constipated. Discussed fruit, fiber and fluids for constipation If pain persists she will return for a pelvic ultrasound.     ~20 minutes in total patient care

## 2020-02-08 ENCOUNTER — Telehealth: Payer: Self-pay

## 2020-02-08 NOTE — Telephone Encounter (Signed)
Patient states she is still having vaginal discomfort.

## 2020-02-08 NOTE — Telephone Encounter (Signed)
AEX 10/2019 H/o dysmenorrhea  Spoke with pt. Pt states having continued vaginal cramps that are uncomfortable. Pt denies vaginal discharge, odor. States is having abd/vaginal pressure with some urinary frequency.  Denies urinary burning, back pain. Pt states is SA and would like to check for STDs. Denies any pain with intercourse. Pt states has not started Anaprox Rx due to 4-5 days out for next cycle to start.  Pt aware of possible PUS. Declines making PUS appt at this time. Would like to discuss more with Dr Oscar La.  Pt advised to have OV for update to Dr Oscar La and plan of care. Pt agreeable. Pt scheduled with Dr Oscar La on 02/09/20 at 230 pm. Pt verbalized understanding to date and time of appt.  Encounter closed.

## 2020-02-09 ENCOUNTER — Encounter: Payer: Self-pay | Admitting: Obstetrics and Gynecology

## 2020-02-09 ENCOUNTER — Other Ambulatory Visit: Payer: Self-pay

## 2020-02-09 ENCOUNTER — Ambulatory Visit: Payer: No Typology Code available for payment source | Admitting: Obstetrics and Gynecology

## 2020-02-09 VITALS — BP 118/64 | HR 85 | Ht 68.25 in | Wt 203.0 lb

## 2020-02-09 DIAGNOSIS — R102 Pelvic and perineal pain: Secondary | ICD-10-CM

## 2020-02-09 DIAGNOSIS — Z113 Encounter for screening for infections with a predominantly sexual mode of transmission: Secondary | ICD-10-CM

## 2020-02-09 DIAGNOSIS — N898 Other specified noninflammatory disorders of vagina: Secondary | ICD-10-CM | POA: Diagnosis not present

## 2020-02-09 NOTE — Patient Instructions (Signed)
Vaginitis Vaginitis is a condition in which the vaginal tissue swells and becomes red (inflamed). This condition is most often caused by a change in the normal balance of bacteria and yeast that live in the vagina. This change causes an overgrowth of certain bacteria or yeast, which causes the inflammation. There are different types of vaginitis, but the most common types are:  Bacterial vaginosis.  Yeast infection (candidiasis).  Trichomoniasis vaginitis. This is a sexually transmitted disease (STD).  Viral vaginitis.  Atrophic vaginitis.  Allergic vaginitis. What are the causes? The cause of this condition depends on the type of vaginitis. It can be caused by:  Bacteria (bacterial vaginosis).  Yeast, which is a fungus (yeast infection).  A parasite (trichomoniasis vaginitis).  A virus (viral vaginitis).  Low hormone levels (atrophic vaginitis). Low hormone levels can occur during pregnancy, breastfeeding, or after menopause.  Irritants, such as bubble baths, scented tampons, and feminine sprays (allergic vaginitis). Other factors can change the normal balance of the yeast and bacteria that live in the vagina. These include:  Antibiotic medicines.  Poor hygiene.  Diaphragms, vaginal sponges, spermicides, birth control pills, and intrauterine devices (IUD).  Sex.  Infection.  Uncontrolled diabetes.  A weakened defense (immune) system. What increases the risk? This condition is more likely to develop in women who:  Smoke.  Use vaginal douches, scented tampons, or scented sanitary pads.  Wear tight-fitting pants.  Wear thong underwear.  Use oral birth control pills or an IUD.  Have sex without a condom.  Have multiple sex partners.  Have an STD.  Frequently use the spermicide nonoxynol-9.  Eat lots of foods high in sugar.  Have uncontrolled diabetes.  Have low estrogen levels.  Have a weakened immune system from an immune disorder or medical  treatment.  Are pregnant or breastfeeding. What are the signs or symptoms? Symptoms vary depending on the cause of the vaginitis. Common symptoms include:  Abnormal vaginal discharge. ? The discharge is white, gray, or yellow with bacterial vaginosis. ? The discharge is thick, white, and cheesy with a yeast infection. ? The discharge is frothy and yellow or greenish with trichomoniasis.  A bad vaginal smell. The smell is fishy with bacterial vaginosis.  Vaginal itching, pain, or swelling.  Sex that is painful.  Pain or burning when urinating. Sometimes there are no symptoms. How is this diagnosed? This condition is diagnosed based on your symptoms and medical history. A physical exam, including a pelvic exam, will also be done. You may also have other tests, including:  Tests to determine the pH level (acidity or alkalinity) of your vagina.  A whiff test, to assess the odor that results when a sample of your vaginal discharge is mixed with a potassium hydroxide solution.  Tests of vaginal fluid. A sample will be examined under a microscope. How is this treated? Treatment varies depending on the type of vaginitis you have. Your treatment may include:  Antibiotic creams or pills to treat bacterial vaginosis and trichomoniasis.  Antifungal medicines, such as vaginal creams or suppositories, to treat a yeast infection.  Medicine to ease discomfort if you have viral vaginitis. Your sexual partner should also be treated.  Estrogen delivered in a cream, pill, suppository, or vaginal ring to treat atrophic vaginitis. If vaginal dryness occurs, lubricants and moisturizing creams may help. You may need to avoid scented soaps, sprays, or douches.  Stopping use of a product that is causing allergic vaginitis. Then using a vaginal cream to treat the symptoms. Follow   these instructions at home: Lifestyle  Keep your genital area clean and dry. Avoid soap, and only rinse the area with  water.  Do not douche or use tampons until your health care provider says it is okay to do so. Use sanitary pads, if needed.  Do not have sex until your health care provider approves. When you can return to sex, practice safe sex and use condoms.  Wipe from front to back. This avoids the spread of bacteria from the rectum to the vagina. General instructions  Take over-the-counter and prescription medicines only as told by your health care provider.  If you were prescribed an antibiotic medicine, take or use it as told by your health care provider. Do not stop taking or using the antibiotic even if you start to feel better.  Keep all follow-up visits as told by your health care provider. This is important. How is this prevented?  Use mild, non-scented products. Do not use things that can irritate the vagina, such as fabric softeners. Avoid the following products if they are scented: ? Feminine sprays. ? Detergents. ? Tampons. ? Feminine hygiene products. ? Soaps or bubble baths.  Let air reach your genital area. ? Wear cotton underwear to reduce moisture buildup. ? Avoid wearing underwear while you sleep. ? Avoid wearing tight pants and underwear or nylons without a cotton panel. ? Avoid wearing thong underwear.  Take off any wet clothing, such as bathing suits, as soon as possible.  Practice safe sex and use condoms. Contact a health care provider if:  You have abdominal pain.  You have a fever.  You have symptoms that last for more than 2-3 days. Get help right away if:  You have a fever and your symptoms suddenly get worse. Summary  Vaginitis is a condition in which the vaginal tissue becomes inflamed.This condition is most often caused by a change in the normal balance of bacteria and yeast that live in the vagina.  Treatment varies depending on the type of vaginitis you have.  Do not douche, use tampons , or have sex until your health care provider approves. When  you can return to sex, practice safe sex and use condoms. This information is not intended to replace advice given to you by your health care provider. Make sure you discuss any questions you have with your health care provider. Document Revised: 04/12/2017 Document Reviewed: 06/05/2016 Elsevier Patient Education  2020 [employer]  

## 2020-02-09 NOTE — Progress Notes (Signed)
GYNECOLOGY  VISIT   HPI: 19 y.o.   Single Black or African American Not Hispanic or Latino  female   G0P0000 with Patient's last menstrual period was 01/14/2020.   here for She is having vaginal discomfort. She states that she will get vaginal cramps and sometimes itching.   She was seen last week with c/o recent worsening in her cramps and some pelvic cramping between cycles. She c/o intermittent constipation. She is on OCP's and recently had negative STD testing with her primary. She was given a script for anaprox and was instructed in treatment of constipation.   She has had some intermittent vaginal irritation. She occasionally has "vaginal cramping", random. Slight itching , no burning, no odor, no discharge, no dyspareunia, no dysuria. The vaginal cramps can come and go over the course of an hour, up to a 5/10 in severity. This started 1.5 weeks ago, prior to that she hadn't had it.  She would like to do full STD testing today.   GYNECOLOGIC HISTORY: Patient's last menstrual period was 01/14/2020. Contraception:Junel  Menopausal hormone therapy: none        OB History    Gravida  0   Para  0   Term  0   Preterm  0   AB  0   Living  0     SAB  0   TAB  0   Ectopic  0   Multiple  0   Live Births  0              Patient Active Problem List   Diagnosis Date Noted   Asthma     Past Medical History:  Diagnosis Date   Asthma     No past surgical history on file.  Current Outpatient Medications  Medication Sig Dispense Refill   albuterol (VENTOLIN HFA) 108 (90 Base) MCG/ACT inhaler Inhale 1-2 puffs into the lungs every 4 (four) hours as needed. 8 g 1   naproxen sodium (ANAPROX DS) 550 MG tablet Take one tablet po q 12 hours prn cramps. 30 tablet 2   norethindrone-ethinyl estradiol (JUNEL FE 1/20) 1-20 MG-MCG tablet Take 1 tablet by mouth daily. 84 tablet 3   No current facility-administered medications for this visit.     ALLERGIES: Patient has no  known allergies.  Family History  Problem Relation Age of Onset   Thyroid disease Mother    Breast cancer Paternal Grandmother     Social History   Socioeconomic History   Marital status: Single    Spouse name: Not on file   Number of children: Not on file   Years of education: Not on file   Highest education level: Not on file  Occupational History   Not on file  Tobacco Use   Smoking status: Never Smoker   Smokeless tobacco: Never Used  Vaping Use   Vaping Use: Never used  Substance and Sexual Activity   Alcohol use: Never   Drug use: Never   Sexual activity: Not Currently    Birth control/protection: Pill  Other Topics Concern   Not on file  Social History Narrative   Not on file   Social Determinants of Health   Financial Resource Strain:    Difficulty of Paying Living Expenses: Not on file  Food Insecurity:    Worried About Running Out of Food in the Last Year: Not on file   Ran Out of Food in the Last Year: Not on file  Transportation Needs:  Lack of Transportation (Medical): Not on file   Lack of Transportation (Non-Medical): Not on file  Physical Activity:    Days of Exercise per Week: Not on file   Minutes of Exercise per Session: Not on file  Stress:    Feeling of Stress : Not on file  Social Connections:    Frequency of Communication with Friends and Family: Not on file   Frequency of Social Gatherings with Friends and Family: Not on file   Attends Religious Services: Not on file   Active Member of Clubs or Organizations: Not on file   Attends Banker Meetings: Not on file   Marital Status: Not on file  Intimate Partner Violence:    Fear of Current or Ex-Partner: Not on file   Emotionally Abused: Not on file   Physically Abused: Not on file   Sexually Abused: Not on file    Review of Systems  Genitourinary:       Vaginal cramping  All other systems reviewed and are negative.   PHYSICAL  EXAMINATION:    BP 118/64    Pulse 85    Ht 5' 8.25" (1.734 m)    Wt 203 lb (92.1 kg)    LMP 01/14/2020    SpO2 98%    BMI 30.64 kg/m     General appearance: alert, cooperative and appears stated age   Pelvic: External genitalia:  no lesions              Urethra:  normal appearing urethra with no masses, tenderness or lesions              Bartholins and Skenes: normal                 Vagina: normal appearing vagina with normal color and discharge, no lesions              Cervix: no cervical motion tenderness and no lesions              Bimanual Exam:  Uterus:  normal size, contour, position, consistency, mobility, non-tender              Adnexa: no mass, fullness, tenderness              Pelvic floor: not tender  Chaperone was present for exam.  Wet prep: no clue, no trich, + wbc KOH: no yeast PH: 4   ASSESSMENT Vaginal pain, normal exam, no pelvic floor tenderness Vulvovaginal itching, negative wet prep, KOH, normal pH Desires STD testing    PLAN Send nuswab for vaginitis and STD's Blood work for STD's Discussed vulvar skin care, hand out given. Use Vaseline externally as needed.

## 2020-02-10 LAB — HEP, RPR, HIV PANEL
HIV Screen 4th Generation wRfx: NONREACTIVE
Hepatitis B Surface Ag: NEGATIVE
RPR Ser Ql: NONREACTIVE

## 2020-02-10 LAB — HEPATITIS C ANTIBODY: Hep C Virus Ab: <0.1 s/co ratio (ref 0.0–0.9)

## 2020-02-12 LAB — NUSWAB VAGINITIS PLUS (VG+)
Candida albicans, NAA: NEGATIVE
Candida glabrata, NAA: NEGATIVE
Chlamydia trachomatis, NAA: NEGATIVE
Neisseria gonorrhoeae, NAA: NEGATIVE
Trich vag by NAA: NEGATIVE

## 2020-02-15 ENCOUNTER — Other Ambulatory Visit: Payer: Self-pay | Admitting: Obstetrics and Gynecology

## 2020-02-15 NOTE — Telephone Encounter (Signed)
Patient is calling in regards to refill. Patient states she would like to have multiple month supply.

## 2020-02-15 NOTE — Telephone Encounter (Signed)
Spoke with patient and notified that prescription was sent to pharmacy 10/14/19 for #84 w/3 refills (verified with pharmacy). Advised patient to check with pharmacy to see when this will be ready. Patient verbalizes understanding and is agreeable. Okay to close encounter.

## 2020-05-11 ENCOUNTER — Ambulatory Visit: Payer: No Typology Code available for payment source | Admitting: Nurse Practitioner

## 2020-05-11 ENCOUNTER — Encounter: Payer: Self-pay | Admitting: Nurse Practitioner

## 2020-05-11 ENCOUNTER — Other Ambulatory Visit: Payer: Self-pay

## 2020-05-11 ENCOUNTER — Ambulatory Visit (INDEPENDENT_AMBULATORY_CARE_PROVIDER_SITE_OTHER): Payer: No Typology Code available for payment source | Admitting: Nurse Practitioner

## 2020-05-11 VITALS — BP 120/78 | HR 70 | Resp 16 | Wt 198.0 lb

## 2020-05-11 DIAGNOSIS — N946 Dysmenorrhea, unspecified: Secondary | ICD-10-CM

## 2020-05-11 DIAGNOSIS — Z113 Encounter for screening for infections with a predominantly sexual mode of transmission: Secondary | ICD-10-CM | POA: Diagnosis not present

## 2020-05-11 DIAGNOSIS — Z3041 Encounter for surveillance of contraceptive pills: Secondary | ICD-10-CM | POA: Diagnosis not present

## 2020-05-11 MED ORDER — JUNEL FE 24 1-20 MG-MCG(24) PO TABS: 1.0000 | ORAL_TABLET | Freq: Every day | ORAL | 3 refills | Status: DC

## 2020-05-11 NOTE — Progress Notes (Signed)
GYNECOLOGY  VISIT  CC:   Cramping, STD screening (no blood work today)  HPI: 19 y.o. G0P0000 Single Black or [place] American female here for abdominal cramping, std testing.  Denies vaginal irritation, itching, burning, odor or discharge  Still having "vaginal cramping" has had a "full work up" with Dr. Oscar La and can't be explained. The cramping is worse with her period but also has them after period. Period lasts about 4 days. Cramping was worse and bleeding heavier when not on pill, so desires to continue OCP but wonders if she is "getting used to her pill so it doesn't work as well". She prefers to have a period every month and does not want extended regimen pill.  Has new partner (old partner, back together again) x 1 week, had sex yesterday wants std screening today.  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 Contraception: ocp Menopausal hormone therapy: none  Patient Active Problem List   Diagnosis Date Noted  . Asthma     Past Medical History:  Diagnosis Date  . Asthma     No past surgical history on file.  MEDS:   Current Outpatient Medications on File Prior to Visit  Medication Sig Dispense Refill  . albuterol (VENTOLIN HFA) 108 (90 Base) MCG/ACT inhaler Inhale 1-2 puffs into the lungs every 4 (four) hours as needed. 8 g 1  . naproxen sodium (ANAPROX DS) 550 MG tablet Take one tablet po q 12 hours prn cramps. 30 tablet 2  . norethindrone-ethinyl estradiol (JUNEL FE 1/20) 1-20 MG-MCG tablet Take 1 tablet by mouth daily. 84 tablet 3  . phentermine 37.5 MG capsule 1 capsule    . topiramate (TOPAMAX) 50 MG tablet Take 50 mg by mouth as needed.     No current facility-administered medications on file prior to visit.    ALLERGIES: Patient has no known allergies.  Family History  Problem Relation Age of Onset  . Thyroid disease Mother   . Breast  cancer Paternal Grandmother       Review of Systems  Constitutional: Negative.   HENT: Negative.   Eyes: Negative.   Respiratory: Negative.   Cardiovascular: Negative.   Gastrointestinal: Negative.   Endocrine: Negative.   Genitourinary:       Abdominal cramping  Musculoskeletal: Negative.   Skin: Negative.   Allergic/Immunologic: Negative.   Neurological: Negative.   Hematological: Negative.   Psychiatric/Behavioral: Negative.     PHYSICAL EXAMINATION:    BP 120/78   Pulse 70   Resp 16   Wt 198 lb (89.8 kg)   LMP 05/03/2020 (Exact Date)   BMI 29.89 kg/m      General appearance: alert, cooperative, no acute distress  Lymph:  no inguinal LAD noted  Pelvic: External genitalia:  no lesions              Urethra:  normal appearing urethra with no masses, tenderness or lesions              Bartholins and Skenes: normal                 Vagina: normal appearing vagina               Cervix: no cervical motion tenderness and no lesions              Bimanual Exam:  Uterus:  normal size, contour, position, consistency, mobility, non-tender  Adnexa: no mass, fullness, tenderness               Chaperone, Ladona Ridgel, CMA, was present for exam.  Assessment: Dysmenorrhea Birth control counseling, management STD screening  Plan: Does not need refill on Anaprox but encouraged to take as needed Will change Junel 1/20 to Junel 24 day regimen, discussed possibly shortening period may help.  May consider Kegel exercises to see if helpful for sensation of vaginal cramps.  Nu swab (GC/CT/Trich) testing today, advised that ideally testing should be done 2 weeks after exposure (maybe too early today). Advised if she becomes symptomatic, should return for re-screening.

## 2020-05-11 NOTE — Patient Instructions (Signed)
Kegel Exercises  Kegel exercises can help strengthen your pelvic floor muscles. The pelvic floor is a group of muscles that support your rectum, small intestine, and bladder. In females, pelvic floor muscles also help support the womb (uterus). These muscles help you control the flow of urine and stool. Kegel exercises are painless and simple, and they do not require any equipment. Your provider may suggest Kegel exercises to:  Improve bladder and bowel control.  Improve sexual response.  Improve weak pelvic floor muscles after surgery to remove the uterus (hysterectomy) or pregnancy (females).  Improve weak pelvic floor muscles after prostate gland removal or surgery (males). Kegel exercises involve squeezing your pelvic floor muscles, which are the same muscles you squeeze when you try to stop the flow of urine or keep from passing gas. The exercises can be done while sitting, standing, or lying down, but it is best to vary your position. Exercises How to do Kegel exercises: 1. Squeeze your pelvic floor muscles tight. You should feel a tight lift in your rectal area. If you are a female, you should also feel a tightness in your vaginal area. Keep your stomach, buttocks, and legs relaxed. 2. Hold the muscles tight for up to 10 seconds. 3. Breathe normally. 4. Relax your muscles. 5. Repeat as told by your health care provider. Repeat this exercise daily as told by your health care provider. Continue to do this exercise for at least 4-6 weeks, or for as long as told by your health care provider. You may be referred to a physical therapist who can help you learn more about how to do Kegel exercises. Depending on your condition, your health care provider may recommend:  Varying how long you squeeze your muscles.  Doing several sets of exercises every day.  Doing exercises for several weeks.  Making Kegel exercises a part of your regular exercise routine. This information is not  intended to replace advice given to you by your health care provider. Make sure you discuss any questions you have with your health care provider. Document Revised: 12/18/2017 Document Reviewed: 12/18/2017 Elsevier Patient Education  2020 [employer].   Safe Sex Practicing safe sex means taking steps before and during sex to reduce your risk of:  Getting an STI (sexually transmitted infection).  Giving your partner an STI.  Unwanted or unplanned pregnancy.    Ways you can practice safe sex  Limit your sexual partners to only one partner who is having sex with only you.  Avoid using alcohol and drugs before having sex. Alcohol and drugs can affect your judgment.  Before having sex with a new partner: ? Talk to your partner about past partners, past STIs, and drug use. ? Get screened for STIs and discuss the results with your partner. Ask your partner to get screened, too.  Check your body regularly for sores, blisters, rashes, or unusual discharge. If you notice any of these problems, visit your health care provider.  Avoid sexual contact if you have symptoms of an infection or you are being treated for an STI.  While having sex, use a condom. Make sure to: ? Use a condom every time you have vaginal, oral, or anal sex. Both females and males should wear condoms during oral sex. ? Keep condoms in place from the beginning to the end of sexual activity. ? Use a latex condom, if possible. Latex condoms offer the best protection. ? Use only water-based lubricants with a condom. Using petroleum-based lubricants or  oils will weaken the condom and increase the chance that it will break. Ways your health care provider can help you practice safe sex  See your health care provider for regular screenings, exams, and tests for STIs.  Talk with your health care provider about what kind of birth control (contraception) is best for you.  Get vaccinated against hepatitis B and human  papillomavirus (HPV).  If you are at risk of being infected with HIV (human immunodeficiency virus), talk with your health care provider about taking a prescription medicine to prevent HIV infection. You are at risk for HIV if you: ? Are a man who has sex with other men. ? Are sexually active with more than one partner. ? Take drugs by injection. ? Have a sex partner who has HIV. ? Have unprotected sex. ? Have sex with someone who has sex with both men and women. ? Have had an STI. Follow these instructions at home:  Take over-the-counter and prescription medicines as told by your health care provider.  Keep all follow-up visits as told by your health care provider. This is important. Where to find more information  Centers for Disease Control and Prevention: LessFurniture.be  Planned Parenthood: https://www.plannedparenthood.org/  Office on Women's Health: EmploymentTracking.tn Summary  Practicing safe sex means taking steps before and during sex to reduce your risk of STIs, giving your partner STIs, and having an unwanted or unplanned pregnancy.  Before having sex with a new partner, talk to your partner about past partners, past STIs, and drug use.  Use a condom every time you have vaginal, oral, or anal sex. Both females and males should wear condoms during oral sex.  Check your body regularly for sores, blisters, rashes, or unusual discharge. If you notice any of these problems, visit your health care provider.  See your health care provider for regular screenings, exams, and tests for STIs. This information is not intended to replace advice given to you by your health care provider. Make sure you discuss any questions you have with your health care provider. Document Revised: 08/22/2018 Document Reviewed: 02/10/2018 Elsevier Patient Education  2020 [employer].

## 2020-05-12 LAB — CHLAMYDIA/GONOCOCCUS/TRICHOMONAS, NAA
Chlamydia by NAA: NEGATIVE
Gonococcus by NAA: NEGATIVE
Trich vag by NAA: NEGATIVE

## 2020-07-31 IMAGING — US US BREAST*R* LIMITED INC AXILLA
1 series · 2 of 2 positions shown · non-contrast
Comparison: None.

CLINICAL DATA: 18-year-old female complaining of a palpable
abnormality in the right breast.

EXAM:
ULTRASOUND OF THE RIGHT BREAST

[Series 1: us breast*right* limited inc axilla · 0.07mm/px · 2 of 2 slices shown]
[im 1/2]
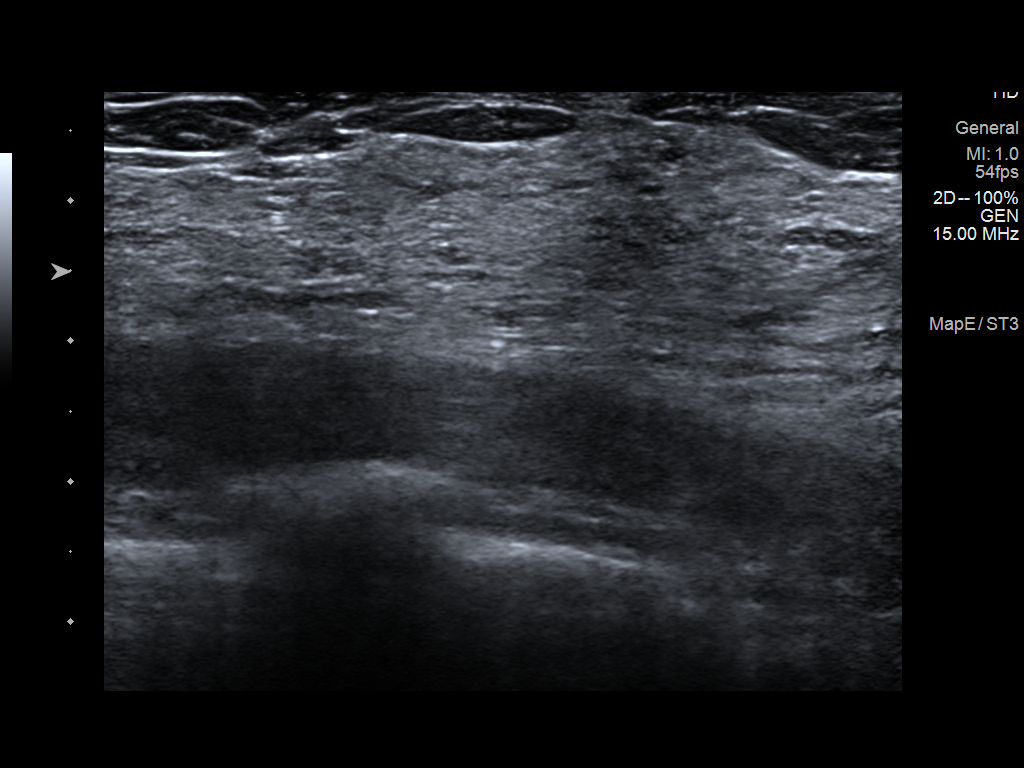
[im 2/2]
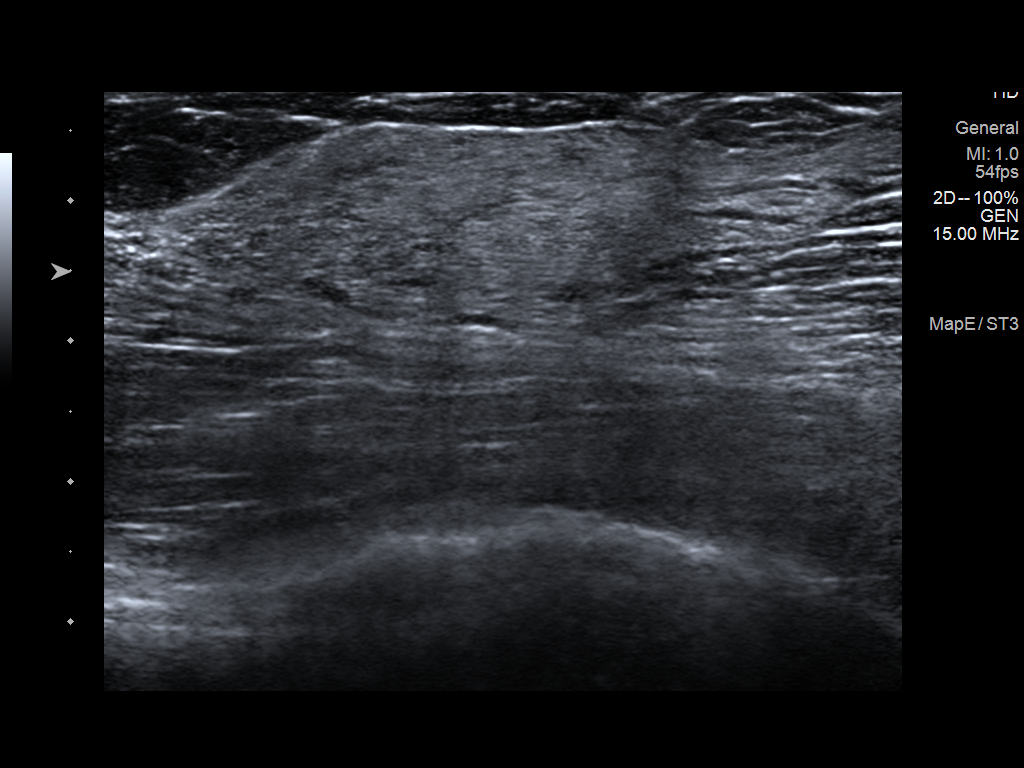

[2 of 2 positions shown; findings below may reference images not displayed]

FINDINGS: On physical exam, I do not palpate a mass in the upper-outer
quadrant right breast.

Targeted ultrasound is performed, showing normal tissue in the area
of clinical concern in the upper-outer quadrant of the right breast.
No solid or cystic mass, abnormal shadowing or distortion
visualized.
IMPRESSION: No sonographic evidence of malignancy in the right breast.

RECOMMENDATION:
If the clinical exam remains benign/stable screening mammography can
be deferred until the age 40.

I have discussed the findings and recommendations with the patient.
If applicable, a reminder letter will be sent to the patient
regarding the next appointment.

BI-RADS CATEGORY  1: Negative.

## 2020-10-27 ENCOUNTER — Ambulatory Visit: Payer: No Typology Code available for payment source | Admitting: Obstetrics and Gynecology

## 2020-10-31 ENCOUNTER — Ambulatory Visit: Payer: No Typology Code available for payment source | Admitting: Advanced Practice Midwife

## 2020-10-31 NOTE — Progress Notes (Deleted)
  GYNECOLOGY PROGRESS NOTE  History:  20 y.o. G0P0000 presents to [facility] *** office today for problem gyn visit. She reports *****.  She denies h/a, dizziness, shortness of breath, n/v, or fever/chills.    The following portions of the patient's history were reviewed and updated as appropriate: allergies, current medications, past family history, past medical history, past social history, past surgical history and problem list. Last pap smear on *** was normal, *** HRHPV.  Health Maintenance Due  Topic Date Due   COVID-19 Vaccine (1) Never done   Pneumococcal Vaccine 81-47 Years old (1 - PCV) Never done   TETANUS/TDAP  10/18/2020   PAP-Cervical Cytology Screening  10/17/2020   PAP SMEAR-Modifier  10/17/2020     Review of Systems:  Pertinent items are noted in HPI.   Objective:  Physical Exam There were no vitals taken for this visit. VS reviewed, nursing note reviewed,  Constitutional: well developed, well nourished, no distress HEENT: normocephalic CV: normal rate Pulm/chest wall: normal effort Breast Exam: deferred Abdomen: soft Neuro: alert and oriented x 3 Skin: warm, dry Psych: affect normal Pelvic exam: Cervix pink, visually closed, without lesion, scant white creamy discharge, vaginal walls and external genitalia normal Bimanual exam: Cervix 0/long/high, firm, anterior, neg CMT, uterus nontender, nonenlarged, adnexa without tenderness, enlargement, or mass  Assessment & Plan:  1. Vaginal irritation    Sharen Counter, CNM 9:00 AM

## 2020-11-18 ENCOUNTER — Ambulatory Visit: Payer: Medicaid Other | Admitting: Obstetrics

## 2020-11-24 ENCOUNTER — Other Ambulatory Visit: Payer: Self-pay

## 2020-11-24 ENCOUNTER — Ambulatory Visit (INDEPENDENT_AMBULATORY_CARE_PROVIDER_SITE_OTHER): Payer: Medicaid Other | Admitting: Obstetrics

## 2020-11-24 ENCOUNTER — Encounter: Payer: Self-pay | Admitting: Obstetrics

## 2020-11-24 ENCOUNTER — Other Ambulatory Visit (HOSPITAL_COMMUNITY)
Admission: RE | Admit: 2020-11-24 | Discharge: 2020-11-24 | Disposition: A | Discharge status: Home / Self Care | Payer: Medicaid Other | Source: Ambulatory Visit | Attending: Obstetrics | Admitting: Obstetrics

## 2020-11-24 VITALS — BP 105/69 | HR 76 | Ht 68.0 in | Wt 209.0 lb

## 2020-11-24 DIAGNOSIS — Z01419 Encounter for gynecological examination (general) (routine) without abnormal findings: Secondary | ICD-10-CM

## 2020-11-24 DIAGNOSIS — Z3009 Encounter for other general counseling and advice on contraception: Secondary | ICD-10-CM

## 2020-11-24 DIAGNOSIS — N898 Other specified noninflammatory disorders of vagina: Secondary | ICD-10-CM

## 2020-11-24 NOTE — Progress Notes (Signed)
Subjective:        Colleen Castillo is a 20 y.o. female here for a routine exam.  Current complaints: Vaginal discharge.  Personal health questionnaire:  Is patient [name], have a family history of breast and/or ovarian cancer: no Is there a family history of uterine cancer diagnosed at age < 44, gastrointestinal cancer, urinary tract cancer, family member who is a Personnel officer syndrome-associated carrier: no Is the patient overweight and hypertensive, family history of diabetes, personal history of gestational diabetes, preeclampsia or PCOS: no Is patient over 73, have PCOS,  family history of premature CHD under age 35, diabetes, smoke, have hypertension or peripheral artery disease:  no At any time, has a partner hit, kicked or otherwise hurt or frightened you?: no Over the past 2 weeks, have you felt down, depressed or hopeless?: no Over the past 2 weeks, have you felt little interest or pleasure in doing things?:no   Gynecologic History No LMP recorded. Contraception: condoms Last Pap: n/a. Results were: n/a Last mammogram: n/a. Results were: n/a  Obstetric History OB History  Gravida Para Term Preterm AB Living  0 0 0 0 0 0  SAB IAB Ectopic Multiple Live Births  0 0 0 0 0    Past Medical History:  Diagnosis Date   Asthma     History reviewed. No pertinent surgical history.   Current Outpatient Medications:    LYSINE PO, Take by mouth., Disp: , Rfl:    phentermine 37.5 MG capsule, 1 capsule, Disp: , Rfl:    albuterol (VENTOLIN HFA) 108 (90 Base) MCG/ACT inhaler, Inhale 1-2 puffs into the lungs every 4 (four) hours as needed. (Patient not taking: Reported on 11/24/2020), Disp: 8 g, Rfl: 1   naproxen sodium (ANAPROX DS) 550 MG tablet, Take one tablet po q 12 hours prn cramps. (Patient not taking: Reported on 11/24/2020), Disp: 30 tablet, Rfl: 2   Norethindrone Acetate-Ethinyl Estrad-FE (JUNEL FE 24) 1-20 MG-MCG(24) tablet, Take 1 tablet by mouth daily. (Patient not  taking: Reported on 11/24/2020), Disp: 84 tablet, Rfl: 3   topiramate (TOPAMAX) 50 MG tablet, Take 50 mg by mouth as needed. (Patient not taking: Reported on 11/24/2020), Disp: , Rfl:  No Known Allergies  Social History   Tobacco Use   Smoking status: Never   Smokeless tobacco: Never  Substance Use Topics   Alcohol use: Never    Family History  Problem Relation Age of Onset   Thyroid disease Mother    Breast cancer Paternal Grandmother       Review of Systems  Constitutional: negative for fatigue and weight loss Respiratory: negative for cough and wheezing Cardiovascular: negative for chest pain, fatigue and palpitations Gastrointestinal: negative for abdominal pain and change in bowel habits Musculoskeletal:negative for myalgias Neurological: negative for gait problems and tremors Behavioral/Psych: negative for abusive relationship, depression Endocrine: negative for temperature intolerance    Genitourinary:positive for vaginal discharge.  negative for abnormal menstrual periods, genital lesions, hot flashes, sexual problems  Integument/breast: negative for breast lump, breast tenderness, nipple discharge and skin lesion(s)    Objective:       BP 105/69   Pulse 76   Ht 5\' 8"  (1.727 m)   Wt 209 lb (94.8 kg)   BMI 31.78 kg/m  General:   Alert and no distress  Skin:   no rash or abnormalities  Lungs:   clear to auscultation bilaterally  Heart:   regular rate and rhythm, S1, S2 normal, no murmur, click, rub or gallop  Breasts:   normal without suspicious masses, skin or nipple changes or axillary nodes  Abdomen:  normal findings: no organomegaly, soft, non-tender and no hernia  Pelvis:  External genitalia: normal general appearance Urinary system: urethral meatus normal and bladder without fullness, nontender Vaginal: normal without tenderness, induration or masses Cervix: normal appearance Adnexa: normal bimanual exam Uterus: anteverted and non-tender, normal size    Lab Review Urine pregnancy test Labs reviewed yes Radiologic studies reviewed yes  I have spent a total of 20 minutes of face-to-face time, excluding clinical staff time, reviewing notes and preparing to see patient, ordering tests and/or medications, and counseling the patient.   Assessment:    1. Encounter for gynecological examination with Papanicolaou smear of cervix Rx: - Cytology - PAP( [place])  2. Vaginal discharge Rx: - Cervicovaginal ancillary only( [place])  3. Encounter for other general counseling and advice on contraception - discussed contraceptive choices - declined hormonal contraception at this time.  Condoms recommended for STD prevention    Plan:    Education reviewed: calcium supplements, depression evaluation, low fat, low cholesterol diet, safe sex/STD prevention, self breast exams, skin cancer screening, and weight bearing exercise. Contraception: abstinence. Follow up in: 1 year.    Brock Bad, MD 11/24/2020 4:25 PM

## 2020-11-25 LAB — CERVICOVAGINAL ANCILLARY ONLY
Bacterial Vaginitis (gardnerella): NEGATIVE
Candida Glabrata: NEGATIVE
Candida Vaginitis: NEGATIVE
Comment: NEGATIVE
Comment: NEGATIVE
Comment: NEGATIVE

## 2020-11-26 ENCOUNTER — Other Ambulatory Visit: Payer: Self-pay

## 2020-11-26 ENCOUNTER — Encounter (HOSPITAL_BASED_OUTPATIENT_CLINIC_OR_DEPARTMENT_OTHER): Payer: Self-pay | Admitting: *Deleted

## 2020-11-26 ENCOUNTER — Emergency Department (HOSPITAL_BASED_OUTPATIENT_CLINIC_OR_DEPARTMENT_OTHER)
Admission: EM | Admit: 2020-11-26 | Discharge: 2020-11-26 | Disposition: A | Discharge status: Home / Self Care | Payer: Medicaid Other | Attending: Emergency Medicine | Admitting: Emergency Medicine

## 2020-11-26 DIAGNOSIS — R102 Pelvic and perineal pain: Secondary | ICD-10-CM

## 2020-11-26 DIAGNOSIS — J45909 Unspecified asthma, uncomplicated: Secondary | ICD-10-CM | POA: Diagnosis not present

## 2020-11-26 LAB — URINALYSIS, ROUTINE W REFLEX MICROSCOPIC
Bilirubin Urine: NEGATIVE
Glucose, UA: NEGATIVE mg/dL
Hgb urine dipstick: NEGATIVE
Ketones, ur: NEGATIVE mg/dL
Leukocytes,Ua: NEGATIVE
Nitrite: NEGATIVE
Protein, ur: NEGATIVE mg/dL
Specific Gravity, Urine: 1.025 (ref 1.005–1.030)
pH: 6.5 (ref 5.0–8.0)

## 2020-11-26 MED ORDER — NAPROXEN 375 MG PO TABS: 375.0000 mg | ORAL_TABLET | Freq: Two times a day (BID) | ORAL | 0 refills | Status: DC | PRN

## 2020-11-26 MED ORDER — KETOROLAC TROMETHAMINE 60 MG/2ML IM SOLN
60.0000 mg | Freq: Once | INTRAMUSCULAR | Status: AC
  Administered 2020-11-26: 60 mg via INTRAMUSCULAR
  Filled 2020-11-26: qty 2

## 2020-11-26 MED ORDER — OXYCODONE-ACETAMINOPHEN 5-325 MG PO TABS
1.0000 | ORAL_TABLET | Freq: Once | ORAL | Status: AC
  Administered 2020-11-26: 1 via ORAL
  Filled 2020-11-26: qty 1

## 2020-11-26 NOTE — ED Triage Notes (Signed)
C/o pelvic pain after pap and pelvic  done x 1 day ago

## 2020-11-26 NOTE — ED Provider Notes (Signed)
[facility] EMERGENCY DEPARTMENT Provider Note   CSN: [id number] Arrival date & time: 11/26/20  0010     History Chief Complaint  Patient presents with   Pelvic Pain    Colleen Castillo is a 20 y.o. female.  20 yo F who had a traumatic experience at an Ob doc tyesterday. Still having pelvic cramping, refuses repeat exam. States no bleeding. No discharge. No urinary symptoms. No other asssociated symptoms.    Pelvic Pain This is a new problem.      Past Medical History:  Diagnosis Date   Asthma     Patient Active Problem List   Diagnosis Date Noted   Asthma     History reviewed. No pertinent surgical history.   OB History     Gravida  0   Para  0   Term  0   Preterm  0   AB  0   Living  0      SAB  0   IAB  0   Ectopic  0   Multiple  0   Live Births  0           Family History  Problem Relation Age of Onset   Thyroid disease Mother    Breast cancer Paternal Grandmother     Social History   Tobacco Use   Smoking status: Never   Smokeless tobacco: Never  Vaping Use   Vaping Use: Never used  Substance Use Topics   Alcohol use: Never   Drug use: Never    Home Medications Prior to Admission medications   Medication Sig Start Date End Date Taking? Authorizing Provider  naproxen (NAPROSYN) 375 MG tablet Take 1 tablet (375 mg total) by mouth 2 (two) times daily as needed. 11/26/20  Yes Domani Bakos, Barbara Cower, MD  LYSINE PO Take by mouth.    [provider]  phentermine 37.5 MG capsule 1 capsule    [provider]    Allergies    Patient has no known allergies.  Review of Systems   Review of Systems  Genitourinary:  Positive for pelvic pain.  All other systems reviewed and are negative.  Physical Exam Updated Vital Signs BP (!) 100/50 (BP Location: Left Arm)   Pulse 84   Temp 98 F (36.7 C) (Oral)   Resp 16   Ht 5\' 8"  (1.727 m)   Wt 94.8 kg   LMP 10/31/2020   SpO2 100%   BMI 31.78 kg/m   Physical  Exam Vitals and nursing note reviewed.  HENT:     Head: Normocephalic.     Nose: Nose normal. No congestion or rhinorrhea.  Eyes:     Pupils: Pupils are equal, round, and reactive to light.  Cardiovascular:     Rate and Rhythm: Normal rate.  Pulmonary:     Effort: Pulmonary effort is normal. No respiratory distress.  Abdominal:     General: Abdomen is flat.  Musculoskeletal:        General: No swelling or tenderness. Normal range of motion.  Skin:    General: Skin is warm and dry.  Neurological:     General: No focal deficit present.    ED Results / Procedures / Treatments   Labs (all labs ordered are listed, but only abnormal results are displayed) Labs Reviewed  URINALYSIS, ROUTINE W REFLEX MICROSCOPIC    EKG None  Radiology No results found.  Procedures Procedures   Medications Ordered in ED Medications  ketorolac (TORADOL) injection 60  mg (60 mg Intramuscular Given 11/26/20 0109)  oxyCODONE-acetaminophen (PERCOCET/ROXICET) 5-325 MG per tablet 1 tablet (1 tablet Oral Given 11/26/20 0109)    ED Course  I have reviewed the triage vital signs and the nursing notes.  Pertinent labs & imaging results that were available during my care of the patient were reviewed by me and considered in my medical decision making (see chart for details).    MDM Rules/Calculators/A&P                          20 yo F w/ likely post traumatic pelvic pain. No bleeding. No discharge. No infection.   Final Clinical Impression(s) / ED Diagnoses Final diagnoses:  Pelvic pain in female    Rx / DC Orders ED Discharge Orders          Ordered    naproxen (NAPROSYN) 375 MG tablet  2 times daily PRN        11/26/20 0153             Doneisha Ivey, Barbara Cower, MD 11/26/20 8546

## 2020-11-30 LAB — CYTOLOGY - PAP
Comment: NEGATIVE
Diagnosis: UNDETERMINED — AB
High risk HPV: POSITIVE — AB

## 2020-12-06 ENCOUNTER — Telehealth: Payer: Self-pay

## 2020-12-06 NOTE — Telephone Encounter (Signed)
TC to pt to make aware of abnormal pap results. Pt aware of need for Colpo and procedure was explained in great details.  Pt stated she will be transferring GYN care Pt made aware to sign ROI and we can fax records to office of choice.

## 2020-12-06 NOTE — Telephone Encounter (Signed)
-----   Message from Brock Bad, MD sent at 12/05/2020  5:56 AM EDT ----- ASCUS with positive HRHPV.  Schedule colposcopy.

## 2020-12-26 ENCOUNTER — Encounter: Payer: Self-pay | Admitting: *Deleted

## 2021-02-01 ENCOUNTER — Encounter: Payer: Medicaid Other | Admitting: Obstetrics & Gynecology

## 2021-12-28 ENCOUNTER — Encounter (HOSPITAL_COMMUNITY): Payer: Self-pay | Admitting: Emergency Medicine

## 2021-12-28 ENCOUNTER — Other Ambulatory Visit: Payer: Self-pay

## 2021-12-28 ENCOUNTER — Emergency Department (HOSPITAL_COMMUNITY)
Admission: EM | Admit: 2021-12-28 | Discharge: 2021-12-28 | Disposition: A | Discharge status: Home / Self Care | Payer: Medicaid Other | Attending: Emergency Medicine | Admitting: Emergency Medicine

## 2021-12-28 ENCOUNTER — Emergency Department (HOSPITAL_COMMUNITY): Payer: Medicaid Other

## 2021-12-28 DIAGNOSIS — Z3A01 Less than 8 weeks gestation of pregnancy: Secondary | ICD-10-CM | POA: Diagnosis not present

## 2021-12-28 DIAGNOSIS — O26891 Other specified pregnancy related conditions, first trimester: Secondary | ICD-10-CM | POA: Insufficient documentation

## 2021-12-28 DIAGNOSIS — Z331 Pregnant state, incidental: Secondary | ICD-10-CM

## 2021-12-28 DIAGNOSIS — O0001 Abdominal pregnancy with intrauterine pregnancy: Secondary | ICD-10-CM | POA: Insufficient documentation

## 2021-12-28 LAB — BASIC METABOLIC PANEL
Anion gap: 6 (ref 5–15)
BUN: 12 mg/dL (ref 6–20)
CO2: 25 mmol/L (ref 22–32)
Calcium: 8.8 mg/dL — ABNORMAL LOW (ref 8.9–10.3)
Chloride: 105 mmol/L (ref 98–111)
Creatinine, Ser: 0.66 mg/dL (ref 0.44–1.00)
GFR, Estimated: >60 mL/min (ref >60)
Glucose, Bld: 76 mg/dL (ref 70–99)
Potassium: 3.8 mmol/L (ref 3.5–5.1)
Sodium: 136 mmol/L (ref 135–145)

## 2021-12-28 LAB — CBC WITH DIFFERENTIAL/PLATELET
Abs Immature Granulocytes: 0.01 10*3/uL (ref 0.00–0.07)
Basophils Absolute: 0 10*3/uL (ref 0.0–0.1)
Basophils Relative: 1 %
Eosinophils Absolute: 0 10*3/uL (ref 0.0–0.5)
Eosinophils Relative: 0 %
HCT: 39.9 % (ref 36.0–46.0)
Hemoglobin: 13.1 g/dL (ref 12.0–15.0)
Immature Granulocytes: 0 %
Lymphocytes Relative: 35 %
Lymphs Abs: 1.2 10*3/uL (ref 0.7–4.0)
MCH: 30.8 pg (ref 26.0–34.0)
MCHC: 32.8 g/dL (ref 30.0–36.0)
MCV: 93.7 fL (ref 80.0–100.0)
Monocytes Absolute: 0.9 10*3/uL (ref 0.1–1.0)
Monocytes Relative: 26 %
Neutro Abs: 1.3 10*3/uL — ABNORMAL LOW (ref 1.7–7.7)
Neutrophils Relative %: 38 %
Platelets: 195 10*3/uL (ref 150–400)
RBC: 4.26 MIL/uL (ref 3.87–5.11)
RDW: 11.9 % (ref 11.5–15.5)
WBC: 3.4 10*3/uL — ABNORMAL LOW (ref 4.0–10.5)
nRBC: 0 % (ref 0.0–0.2)

## 2021-12-28 LAB — HCG, QUANTITATIVE, PREGNANCY: hCG, Beta Chain, Quant, S: 143681 m[IU]/mL — ABNORMAL HIGH (ref <5)

## 2021-12-28 NOTE — ED Provider Notes (Signed)
[facility]-EMERGENCY DEPT Provider Note   CSN: [id number] Arrival date & time: 12/28/21  1001     History  Chief Complaint  Patient presents with   Abdominal Pain   Possible Pregnancy    Colleen Castillo is a 21 y.o. female.  Patient states she is pregnant and she has been having lower abdominal pain.  No other medical problems.  No bleeding  The history is provided by the patient and medical records. No language interpreter was used.  Abdominal Pain Pain location:  Suprapubic Pain quality: aching   Pain radiates to:  Does not radiate Pain severity:  Moderate Onset quality:  Sudden Timing:  Constant Progression:  Worsening Chronicity:  New Relieved by:  Nothing Worsened by:  Nothing Ineffective treatments:  None tried Associated symptoms: no chest pain, no cough, no diarrhea, no fatigue and no hematuria   Possible Pregnancy Associated symptoms include abdominal pain. Pertinent negatives include no chest pain and no headaches.       Home Medications Prior to Admission medications   Medication Sig Start Date End Date Taking? Authorizing Provider  LYSINE PO Take by mouth.    [provider]  naproxen (NAPROSYN) 375 MG tablet Take 1 tablet (375 mg total) by mouth 2 (two) times daily as needed. 11/26/20   Mesner, Barbara Cower, MD  phentermine 37.5 MG capsule 1 capsule    [provider]      Allergies    Patient has no known allergies.    Review of Systems   Review of Systems  Constitutional:  Negative for appetite change and fatigue.  HENT:  Negative for congestion, ear discharge and sinus pressure.   Eyes:  Negative for discharge.  Respiratory:  Negative for cough.   Cardiovascular:  Negative for chest pain.  Gastrointestinal:  Positive for abdominal pain. Negative for diarrhea.  Genitourinary:  Negative for frequency and hematuria.  Musculoskeletal:  Negative for back pain.  Skin:  Negative for rash.  Neurological:  Negative for  seizures and headaches.  Psychiatric/Behavioral:  Negative for hallucinations.     Physical Exam Updated Vital Signs BP 125/70 (BP Location: Left Arm)   Pulse 66   Temp 98.6 F (37 C) (Oral)   Resp 18   Ht 5\' 8"  (1.727 m)   Wt 100.7 kg   SpO2 100%   BMI 33.75 kg/m  Physical Exam Vitals and nursing note reviewed.  Constitutional:      Appearance: She is well-developed.  HENT:     Head: Normocephalic.     Nose: Nose normal.  Eyes:     General: No scleral icterus.    Conjunctiva/sclera: Conjunctivae normal.  Neck:     Thyroid: No thyromegaly.  Cardiovascular:     Rate and Rhythm: Normal rate and regular rhythm.     Heart sounds: No murmur heard.    No friction rub. No gallop.  Pulmonary:     Breath sounds: No stridor. No wheezing or rales.  Chest:     Chest wall: No tenderness.  Abdominal:     General: There is no distension.     Tenderness: There is abdominal tenderness. There is no rebound.  Musculoskeletal:        General: Normal range of motion.     Cervical back: Neck supple.  Lymphadenopathy:     Cervical: No cervical adenopathy.  Skin:    Findings: No erythema or rash.  Neurological:     Mental Status: She is alert and oriented to person,  place, and time.     Motor: No abnormal muscle tone.     Coordination: Coordination normal.  Psychiatric:        Behavior: Behavior normal.     ED Results / Procedures / Treatments   Labs (all labs ordered are listed, but only abnormal results are displayed) Labs Reviewed  CBC WITH DIFFERENTIAL/PLATELET - Abnormal; Notable for the following components:      Result Value   WBC 3.4 (*)    Neutro Abs 1.3 (*)    All other components within normal limits  BASIC METABOLIC PANEL - Abnormal; Notable for the following components:   Calcium 8.8 (*)    All other components within normal limits  HCG, QUANTITATIVE, PREGNANCY - Abnormal; Notable for the following components:   hCG, Beta Chain, Quant, S [id number] (*)    All  other components within normal limits    EKG None  Radiology US OB Comp Less 14 Wks  Result Date: 12/28/2021 CLINICAL DATA:  Pelvic pain.  Beta HCG equal 143,000 EXAM: OBSTETRIC <14 WK [place] AND TRANSVAGINAL OB US TECHNIQUE: Both transabdominal and transvaginal ultrasound examinations were performed for complete evaluation of the gestation as well as the maternal uterus, adnexal regions, and pelvic cul-de-sac. Transvaginal technique was performed to assess early pregnancy. COMPARISON:  None Available. FINDINGS: Intrauterine gestational sac: Single Yolk sac:  Present Embryo:  Present Cardiac Activity: Present Heart Rate: 157 bpm CRL:  15.4 mm   7 w   6 d                  [place] EDC: 08/10/2022 Subchorionic hemorrhage:  None visualized. Maternal uterus/adnexae: RIGHT ovary normal. LEFT ovary not identified. IMPRESSION: 1. Single intrauterine gestation with embryo and normal cardiac activity. 2. Estimated gestational age by crown rump length equals 7 weeks 6 days. Electronically Signed   By: Genevive Bi M.D.   On: 12/28/2021 14:36    Procedures Procedures    Medications Ordered in ED Medications - No data to display  ED Course/ Medical Decision Making/ A&P                           Medical Decision Making Amount and/or Complexity of Data Reviewed Labs: ordered. Radiology: ordered.  This patient presents to the ED for concern of lower abdominal pain, this involves an extensive number of treatment options, and is a complaint that carries with it a high risk of complications and morbidity.  The differential diagnosis includes ectopic pregnancy, miscarriage   Co morbidities that complicate the patient evaluation  Pregnant   Additional history obtained:  Additional history obtained from friend External records from outside source obtained and reviewed including hospital records   Lab Tests:  I Ordered, and personally interpreted labs.  The pertinent results include: CBC shows white  count 3.4, quantitative beta-hCG is about 144,000   Imaging Studies ordered:  I ordered imaging studies including ultrasound of the pelvis I independently visualized and interpreted imaging which showed intrauterine pregnancy I agree with the radiologist interpretation   Cardiac Monitoring: / EKG:  The patient was maintained on a cardiac monitor.  I personally viewed and interpreted the cardiac monitored which showed an underlying rhythm of: Normal sinus rhythm   Consultations Obtained:  No consultant  Problem List / ED Course / Critical interventions / Medication management  Pelvic pain and pregnancy No medicines Reevaluation of the patient after these medicines showed that the patient stayed the same  I have reviewed the patients home medicines and have made adjustments as needed   Social Determinants of Health:  None   Test / Admission - Considered:  None  Patient was in urine pregnancy 7 weeks 6 days.  She will be discharged home to follow-up with OB/GYN        Final Clinical Impression(s) / ED Diagnoses Final diagnoses:  IUP (intrauterine pregnancy), incidental    Rx / DC Orders ED Discharge Orders     None         Bethann Berkshire, MD 12/30/21 1112

## 2021-12-28 NOTE — ED Triage Notes (Signed)
Pt reports that she has been experiencing mid abd pain and wants to make sure that is normal for her being pregnant. Pt does not know how far along she is but states she found out she was pregnant 8/8

## 2021-12-28 NOTE — Discharge Instructions (Signed)
Follow-up with your OB/GYN as scheduled.  You can take Tylenol for pain if necessary.  Return if problem

## 2022-01-18 LAB — OB RESULTS CONSOLE HEPATITIS B SURFACE ANTIGEN: Hepatitis B Surface Ag: NEGATIVE

## 2022-01-18 LAB — OB RESULTS CONSOLE HIV ANTIBODY (ROUTINE TESTING): HIV: NONREACTIVE

## 2022-01-18 LAB — OB RESULTS CONSOLE RPR: RPR: NONREACTIVE

## 2022-01-18 LAB — OB RESULTS CONSOLE ABO/RH: RH Type: POSITIVE

## 2022-01-18 LAB — OB RESULTS CONSOLE ANTIBODY SCREEN: Antibody Screen: NEGATIVE

## 2022-01-18 LAB — OB RESULTS CONSOLE RUBELLA ANTIBODY, IGM: Rubella: IMMUNE

## 2022-01-18 LAB — HEPATITIS C ANTIBODY: HCV Ab: NEGATIVE

## 2022-01-24 LAB — OB RESULTS CONSOLE GC/CHLAMYDIA
Chlamydia: NEGATIVE
Neisseria Gonorrhea: NEGATIVE

## 2022-01-25 ENCOUNTER — Other Ambulatory Visit: Payer: Self-pay

## 2022-02-13 ENCOUNTER — Other Ambulatory Visit (HOSPITAL_COMMUNITY): Payer: Self-pay | Admitting: Obstetrics and Gynecology

## 2022-02-13 ENCOUNTER — Other Ambulatory Visit: Payer: Self-pay | Admitting: Obstetrics and Gynecology

## 2022-02-13 DIAGNOSIS — O26849 Uterine size-date discrepancy, unspecified trimester: Secondary | ICD-10-CM

## 2022-02-15 ENCOUNTER — Inpatient Hospital Stay: Admission: RE | Admit: 2022-02-15 | Payer: Medicaid Other | Source: Ambulatory Visit

## 2022-02-15 ENCOUNTER — Ambulatory Visit: Payer: Medicaid Other | Attending: Obstetrics and Gynecology

## 2022-02-15 DIAGNOSIS — O26842 Uterine size-date discrepancy, second trimester: Secondary | ICD-10-CM

## 2022-02-15 DIAGNOSIS — O26849 Uterine size-date discrepancy, unspecified trimester: Secondary | ICD-10-CM | POA: Diagnosis present

## 2022-02-15 DIAGNOSIS — Z3A14 14 weeks gestation of pregnancy: Secondary | ICD-10-CM

## 2022-03-18 ENCOUNTER — Inpatient Hospital Stay (HOSPITAL_COMMUNITY)
Admission: AD | Admit: 2022-03-18 | Discharge: 2022-03-18 | Disposition: A | Discharge status: Home / Self Care | Payer: Medicaid Other | Attending: Obstetrics and Gynecology | Admitting: Obstetrics and Gynecology

## 2022-03-18 ENCOUNTER — Other Ambulatory Visit: Payer: Self-pay

## 2022-03-18 ENCOUNTER — Encounter (HOSPITAL_COMMUNITY): Payer: Self-pay

## 2022-03-18 DIAGNOSIS — O368120 Decreased fetal movements, second trimester, not applicable or unspecified: Secondary | ICD-10-CM | POA: Insufficient documentation

## 2022-03-18 DIAGNOSIS — Z3A19 19 weeks gestation of pregnancy: Secondary | ICD-10-CM | POA: Insufficient documentation

## 2022-03-18 DIAGNOSIS — O26892 Other specified pregnancy related conditions, second trimester: Secondary | ICD-10-CM | POA: Insufficient documentation

## 2022-03-18 DIAGNOSIS — R1011 Right upper quadrant pain: Secondary | ICD-10-CM

## 2022-03-18 DIAGNOSIS — J45909 Unspecified asthma, uncomplicated: Secondary | ICD-10-CM | POA: Insufficient documentation

## 2022-03-18 DIAGNOSIS — O99512 Diseases of the respiratory system complicating pregnancy, second trimester: Secondary | ICD-10-CM | POA: Insufficient documentation

## 2022-03-18 LAB — COMPREHENSIVE METABOLIC PANEL
ALT: 18 U/L (ref 0–44)
AST: 18 U/L (ref 15–41)
Albumin: 2.7 g/dL — ABNORMAL LOW (ref 3.5–5.0)
Alkaline Phosphatase: 38 U/L (ref 38–126)
Anion gap: 8 (ref 5–15)
BUN: 9 mg/dL (ref 6–20)
CO2: 23 mmol/L (ref 22–32)
Calcium: 8.8 mg/dL — ABNORMAL LOW (ref 8.9–10.3)
Chloride: 107 mmol/L (ref 98–111)
Creatinine, Ser: 0.62 mg/dL (ref 0.44–1.00)
GFR, Estimated: >60 mL/min (ref >60)
Glucose, Bld: 90 mg/dL (ref 70–99)
Potassium: 4 mmol/L (ref 3.5–5.1)
Sodium: 138 mmol/L (ref 135–145)
Total Bilirubin: 0.4 mg/dL (ref 0.3–1.2)
Total Protein: 5.7 g/dL — ABNORMAL LOW (ref 6.5–8.1)

## 2022-03-18 LAB — CBC WITH DIFFERENTIAL/PLATELET
Abs Immature Granulocytes: 0.04 10*3/uL (ref 0.00–0.07)
Basophils Absolute: 0 10*3/uL (ref 0.0–0.1)
Basophils Relative: 0 %
Eosinophils Absolute: 0 10*3/uL (ref 0.0–0.5)
Eosinophils Relative: 0 %
HCT: 31.7 % — ABNORMAL LOW (ref 36.0–46.0)
Hemoglobin: 10.7 g/dL — ABNORMAL LOW (ref 12.0–15.0)
Immature Granulocytes: 1 %
Lymphocytes Relative: 19 %
Lymphs Abs: 1.2 10*3/uL (ref 0.7–4.0)
MCH: 30.8 pg (ref 26.0–34.0)
MCHC: 33.8 g/dL (ref 30.0–36.0)
MCV: 91.4 fL (ref 80.0–100.0)
Monocytes Absolute: 0.6 10*3/uL (ref 0.1–1.0)
Monocytes Relative: 10 %
Neutro Abs: 4.4 10*3/uL (ref 1.7–7.7)
Neutrophils Relative %: 70 %
Platelets: 187 10*3/uL (ref 150–400)
RBC: 3.47 MIL/uL — ABNORMAL LOW (ref 3.87–5.11)
RDW: 12.3 % (ref 11.5–15.5)
WBC: 6.2 10*3/uL (ref 4.0–10.5)
nRBC: 0 % (ref 0.0–0.2)

## 2022-03-18 LAB — AMYLASE: Amylase: 53 U/L (ref 28–100)

## 2022-03-18 LAB — LIPASE, BLOOD: Lipase: 27 U/L (ref 11–51)

## 2022-03-18 NOTE — MAU Provider Note (Signed)
Chief Complaint: Decreased Fetal Movement   Event Date/Time   First Provider Initiated Contact with Patient 03/18/22 1642     SUBJECTIVE HPI: Colleen Castillo is a 21 y.o. G1P0000 at [redacted]w[redacted]d who presents to Maternity Admissions reporting intermittent RUQ pain x month. Worse when lying on sides.  None now. Started after her last OB appt so has not talked to [facility] provider about it.   Location: RUQ Quality: sharp Severity: 8-9/10 on pain scale at worst. Mild now.  Duration: 1 month Context: 19 weeks  Timing: intermittent  Modifying factors: worse when lying on sides. Hasn't noticed relationship to eating.  Associated signs and symptoms: Neg for fever, chills, N/V/D/C.   Also reports not feeling baby move as she is very anxious because her friend had a recent IUFD.  Past Medical History:  Diagnosis Date   Asthma    OB History  Gravida Para Term Preterm AB Living  1 0 0 0 0 0  SAB IAB Ectopic Multiple Live Births  0 0 0 0 0    # Outcome Date GA Lbr Len/2nd Weight Sex Delivery Anes PTL Lv  1 Current            No past surgical history on file. Social History   Socioeconomic History   Marital status: Single    Spouse name: Not on file   Number of children: Not on file   Years of education: Not on file   Highest education level: Not on file  Occupational History   Not on file  Tobacco Use   Smoking status: Never   Smokeless tobacco: Never  Vaping Use   Vaping Use: Never used  Substance and Sexual Activity   Alcohol use: Never   Drug use: Never   Sexual activity: Not Currently    Birth control/protection: None  Other Topics Concern   Not on file  Social History Narrative   Not on file   Social Determinants of Health   Financial Resource Strain: Not on file  Food Insecurity: Not on file  Transportation Needs: Not on file  Physical Activity: Not on file  Stress: Not on file  Social Connections: Not on file  Intimate Partner Violence: Not on file   Family History   Problem Relation Age of Onset   Thyroid disease Mother    Breast cancer Paternal Grandmother    No current facility-administered medications on file prior to encounter.   No current outpatient medications on file prior to encounter.   No Known Allergies  I have reviewed patient's Past Medical Hx, Surgical Hx, Family Hx, Social Hx, medications and allergies.   Review of Systems  Constitutional:  Negative for appetite change, chills and fever.  Gastrointestinal:  Positive for abdominal pain. Negative for constipation, diarrhea, nausea and vomiting.  Genitourinary:  Negative for vaginal bleeding.    OBJECTIVE Patient Vitals for the past 24 hrs:  BP Temp Temp src Pulse Resp SpO2 Height Weight  03/18/22 1630 112/78 98 F (36.7 C) Oral 86 19 99 % -- --  03/18/22 1625 -- -- -- -- -- -- 5' 8.25" (1.734 m) 115.3 kg   Constitutional: Well-developed, well-nourished female in no acute distress.  Cardiovascular: normal rate Respiratory: normal rate and effort.  GI: Abd soft, mild RUQ tenderness, gravid appropriate for gestational age. MS: Extremities nontender, no edema, normal ROM Neurologic: Alert and oriented x 4.  GU: Deferred  FHR 149  LAB RESULTS Results for orders placed or performed during the hospital encounter of  03/18/22 (from the past 24 hour(s))  CBC with Differential/Platelet     Status: Abnormal   Collection Time: 03/18/22  4:33 PM  Result Value Ref Range   WBC 6.2 4.0 - 10.5 K/uL   RBC 3.47 (L) 3.87 - 5.11 MIL/uL   Hemoglobin 10.7 (L) 12.0 - 15.0 g/dL   HCT 31.7 (L) 36.0 - 46.0 %   MCV 91.4 80.0 - 100.0 fL   MCH 30.8 26.0 - 34.0 pg   MCHC 33.8 30.0 - 36.0 g/dL   RDW 12.3 11.5 - 15.5 %   Platelets 187 150 - 400 K/uL   nRBC 0.0 0.0 - 0.2 %   Neutrophils Relative % 70 %   Neutro Abs 4.4 1.7 - 7.7 K/uL   Lymphocytes Relative 19 %   Lymphs Abs 1.2 0.7 - 4.0 K/uL   Monocytes Relative 10 %   Monocytes Absolute 0.6 0.1 - 1.0 K/uL   Eosinophils Relative 0 %    Eosinophils Absolute 0.0 0.0 - 0.5 K/uL   Basophils Relative 0 %   Basophils Absolute 0.0 0.0 - 0.1 K/uL   Immature Granulocytes 1 %   Abs Immature Granulocytes 0.04 0.00 - 0.07 K/uL  Comprehensive metabolic panel     Status: Abnormal   Collection Time: 03/18/22  4:33 PM  Result Value Ref Range   Sodium 138 135 - 145 mmol/L   Potassium 4.0 3.5 - 5.1 mmol/L   Chloride 107 98 - 111 mmol/L   CO2 23 22 - 32 mmol/L   Glucose, Bld 90 70 - 99 mg/dL   BUN 9 6 - 20 mg/dL   Creatinine, Ser 0.62 0.44 - 1.00 mg/dL   Calcium 8.8 (L) 8.9 - 10.3 mg/dL   Total Protein 5.7 (L) 6.5 - 8.1 g/dL   Albumin 2.7 (L) 3.5 - 5.0 g/dL   AST 18 15 - 41 U/L   ALT 18 0 - 44 U/L   Alkaline Phosphatase 38 38 - 126 U/L   Total Bilirubin 0.4 0.3 - 1.2 mg/dL   GFR, Estimated >60 >60 mL/min   Anion gap 8 5 - 15  Amylase     Status: None   Collection Time: 03/18/22  4:33 PM  Result Value Ref Range   Amylase 53 28 - 100 U/L  Lipase, blood     Status: None   Collection Time: 03/18/22  4:33 PM  Result Value Ref Range   Lipase 27 11 - 51 U/L    IMAGING No results found.  MAU COURSE Orders Placed This Encounter  Procedures   CBC with Differential/Platelet   Comprehensive metabolic panel   Amylase   Lipase, blood   Discharge patient   No orders of the defined types were placed in this encounter.   MDM - Intermittent RUQ pain x 1 month. Not present during MAU visit.  Low suspicion for cholecystitis due to normal labs absence of fever, chills, leukocytosis.  Low suspicion for cholelithiasis with obstruction due to normal labs and absence of pain.  Recommend keeping food log in case this is biliary colic.  May be able to identify foods to avoid to minimize symptoms.  MyChart message sent suggesting that she discussed with OB/GYN provider at appointment this week may consider right upper quadrant ultrasound to evaluate for gallstones.  Decreased fetal movement at [redacted] weeks gestation.  Patient very anxious due to  friend's recent fetal demise.  The patient very reassured to hear the baby's heart rate.  ASSESSMENT 1. RUQ pain  2. Decreased fetal movements in second trimester, single or unspecified fetus     PLAN Discharge home in stable condition. Gallstone/cholecystitis precautions Keep food log  Follow-up Information     [facility] Follow up.   Why: As scheduled or sooner as needed if symptoms worsen Contact information: [address] [place] [phone]        [facility] Follow up.   Specialty: Obstetrics and Gynecology Why: As needed in emergencies Contact information: [address] [id number] [place] [phone]               Allergies as of 03/18/2022   No Known Allergies      Medication List     STOP taking these medications    LYSINE PO   naproxen 375 MG tablet Commonly known as: NAPROSYN   phentermine 37.5 MG capsule         [place], [place], [place] 03/18/2022  6:57 PM

## 2022-03-18 NOTE — MAU Note (Signed)
Colleen Castillo is a 21 y.o. at [redacted]w[redacted]d here in MAU reporting: decreased FM, states baby has been "quiet" today.  Denies VB or LOF. States family member recently had stillborn and is nervous. Also reports had intermittent RUQ pain last night, currently no pain noted LMP: N/A Onset of complaint: today Pain score: 0 Vitals:   03/18/22 1630  BP: 112/78  Pulse: 86  Resp: 19  Temp: 98 F (36.7 C)  SpO2: 99%     FHT:149 bpm Lab orders placed from triage:   UA

## 2022-03-18 NOTE — Discharge Instructions (Signed)
The only practicing doing waterbirth currently are [employer] and [employer] for [employer].   [place] for [employer] at [employer] [facility]             [address] [place], [place] [address] [place] for [facility] at [place], [place], [place], [place], [address] [phone]  Center for [facility] at [place], [place], [place], [place], [address] [phone]  Center for [facility] at [facility] [address], [place], [place], [place], [address] [phone]  Center for [facility] at [facility]                                 [place], [place], [place], [address] [phone]  Center for [facility] at [facility]                                    [address], [place], [place], [address] [place] for [place] at [facility] [address], [place], [place], [place], [address]                              [facility] [place], [place], [place], [place], [address] [phone]  [facility]         Phone: [phone]  [facility]      Phone: [phone]   [facility]      Phone: [place] [facility]         Phone: [phone]   [place] [facility]-M[facility]    Phone: [place]      Phone: [phone]  [facility]     Phone: [phone]  [facility]        Phone: [phone]  [facility] [place]) [phone]  [facility]      Phone: [phone]

## 2022-03-23 ENCOUNTER — Other Ambulatory Visit: Payer: Self-pay

## 2022-03-23 ENCOUNTER — Other Ambulatory Visit: Payer: Self-pay | Admitting: Obstetrics and Gynecology

## 2022-03-23 DIAGNOSIS — O99212 Obesity complicating pregnancy, second trimester: Secondary | ICD-10-CM

## 2022-04-18 ENCOUNTER — Encounter: Payer: Self-pay | Admitting: *Deleted

## 2022-04-18 ENCOUNTER — Ambulatory Visit: Payer: Medicaid Other | Attending: Obstetrics and Gynecology

## 2022-04-18 ENCOUNTER — Ambulatory Visit: Payer: Medicaid Other | Admitting: *Deleted

## 2022-04-18 ENCOUNTER — Other Ambulatory Visit: Payer: Self-pay | Admitting: *Deleted

## 2022-04-18 VITALS — BP 115/61 | HR 96

## 2022-04-18 DIAGNOSIS — O99212 Obesity complicating pregnancy, second trimester: Secondary | ICD-10-CM | POA: Diagnosis not present

## 2022-04-18 DIAGNOSIS — Z3689 Encounter for other specified antenatal screening: Secondary | ICD-10-CM | POA: Insufficient documentation

## 2022-04-18 DIAGNOSIS — Z3A23 23 weeks gestation of pregnancy: Secondary | ICD-10-CM | POA: Diagnosis not present

## 2022-04-18 DIAGNOSIS — O26842 Uterine size-date discrepancy, second trimester: Secondary | ICD-10-CM

## 2022-04-18 DIAGNOSIS — E668 Other obesity: Secondary | ICD-10-CM | POA: Insufficient documentation

## 2022-04-18 DIAGNOSIS — Z362 Encounter for other antenatal screening follow-up: Secondary | ICD-10-CM

## 2022-05-16 ENCOUNTER — Ambulatory Visit: Payer: Medicaid Other | Admitting: *Deleted

## 2022-05-16 ENCOUNTER — Ambulatory Visit: Payer: Medicaid Other | Attending: Maternal & Fetal Medicine

## 2022-05-16 ENCOUNTER — Other Ambulatory Visit: Payer: Self-pay | Admitting: *Deleted

## 2022-05-16 VITALS — BP 102/65 | HR 105

## 2022-05-16 DIAGNOSIS — Z362 Encounter for other antenatal screening follow-up: Secondary | ICD-10-CM | POA: Insufficient documentation

## 2022-05-16 DIAGNOSIS — Z3A27 27 weeks gestation of pregnancy: Secondary | ICD-10-CM | POA: Diagnosis not present

## 2022-05-16 DIAGNOSIS — Z3492 Encounter for supervision of normal pregnancy, unspecified, second trimester: Secondary | ICD-10-CM

## 2022-05-16 DIAGNOSIS — O99212 Obesity complicating pregnancy, second trimester: Secondary | ICD-10-CM | POA: Diagnosis not present

## 2022-05-16 DIAGNOSIS — O99352 Diseases of the nervous system complicating pregnancy, second trimester: Secondary | ICD-10-CM | POA: Insufficient documentation

## 2022-05-16 DIAGNOSIS — O26842 Uterine size-date discrepancy, second trimester: Secondary | ICD-10-CM | POA: Diagnosis not present

## 2022-05-16 DIAGNOSIS — E669 Obesity, unspecified: Secondary | ICD-10-CM | POA: Diagnosis not present

## 2022-06-13 ENCOUNTER — Ambulatory Visit: Payer: Medicaid Other | Admitting: *Deleted

## 2022-06-13 ENCOUNTER — Ambulatory Visit: Payer: Medicaid Other | Attending: Maternal & Fetal Medicine

## 2022-06-13 VITALS — BP 118/62 | HR 111

## 2022-06-13 DIAGNOSIS — O99213 Obesity complicating pregnancy, third trimester: Secondary | ICD-10-CM | POA: Diagnosis not present

## 2022-06-13 DIAGNOSIS — G9389 Other specified disorders of brain: Secondary | ICD-10-CM | POA: Insufficient documentation

## 2022-06-13 DIAGNOSIS — O99353 Diseases of the nervous system complicating pregnancy, third trimester: Secondary | ICD-10-CM | POA: Diagnosis not present

## 2022-06-13 DIAGNOSIS — E669 Obesity, unspecified: Secondary | ICD-10-CM | POA: Diagnosis not present

## 2022-06-13 DIAGNOSIS — Z3A31 31 weeks gestation of pregnancy: Secondary | ICD-10-CM | POA: Insufficient documentation

## 2022-06-13 DIAGNOSIS — O26843 Uterine size-date discrepancy, third trimester: Secondary | ICD-10-CM | POA: Insufficient documentation

## 2022-06-13 DIAGNOSIS — Z3492 Encounter for supervision of normal pregnancy, unspecified, second trimester: Secondary | ICD-10-CM | POA: Diagnosis present

## 2022-06-21 ENCOUNTER — Encounter (HOSPITAL_COMMUNITY): Payer: Self-pay | Admitting: Obstetrics and Gynecology

## 2022-06-21 ENCOUNTER — Inpatient Hospital Stay (HOSPITAL_COMMUNITY)
Admission: AD | Admit: 2022-06-21 | Discharge: 2022-06-21 | Disposition: A | Discharge status: Home / Self Care | Payer: Medicaid Other | Attending: Obstetrics and Gynecology | Admitting: Obstetrics and Gynecology

## 2022-06-21 DIAGNOSIS — Z3689 Encounter for other specified antenatal screening: Secondary | ICD-10-CM | POA: Diagnosis not present

## 2022-06-21 DIAGNOSIS — Z3A32 32 weeks gestation of pregnancy: Secondary | ICD-10-CM | POA: Insufficient documentation

## 2022-06-21 DIAGNOSIS — Z3493 Encounter for supervision of normal pregnancy, unspecified, third trimester: Secondary | ICD-10-CM

## 2022-06-21 DIAGNOSIS — O26893 Other specified pregnancy related conditions, third trimester: Secondary | ICD-10-CM | POA: Diagnosis present

## 2022-06-21 NOTE — MAU Provider Note (Signed)
Event Date/Time   First Provider Initiated Contact with Patient 06/21/22 2123     S [name] is a 22 y.o. G1P0000 pregnant female at 87w6dwho presents to MAU today with complaint of decreased fetal movement all day until she got to MAU. Reports a fairly busy day at work and at home, realized this evening that she had not felt the baby move as much as usual so she came in for evaluation. No other physical complaints, specifically denies vaginal bleeding, LOF or cramping.   [place] [facility].  Pertinent items noted in HPI and remainder of comprehensive ROS otherwise negative.   O BP 120/72   Pulse 92   Temp 98.6 F (37 C)   Resp 17   Ht 5' 9"$  (1.753 m)   Wt 265 lb (120.2 kg)   SpO2 98%   BMI 39.13 kg/m  Physical Exam Vitals and nursing note reviewed.  Constitutional:      Appearance: Normal appearance.  HENT:     Head: Normocephalic.  Eyes:     Pupils: Pupils are equal, round, and reactive to light.  Cardiovascular:     Rate and Rhythm: Normal rate and regular rhythm.  Pulmonary:     Effort: Pulmonary effort is normal.  Abdominal:     Palpations: Abdomen is soft.  Musculoskeletal:        General: Normal range of motion.  Skin:    General: Skin is warm and dry.     Capillary Refill: Capillary refill takes less than 2 seconds.  Neurological:     Mental Status: She is alert and oriented to person, place, and time.  Psychiatric:        Mood and Affect: Mood normal.        Behavior: Behavior normal.        Thought Content: Thought content normal.        Judgment: Judgment normal.   Fetal Tracing: reactive Baseline: 130 Variability: moderate Accelerations: 15x15 Decelerations: none Toco:  UI  MDM: Low  MAU Course: Baby began moving immediately once monitors applied, NST reactive and patient reassured.  A Fetal movement present during pregnancy in third trimester - Plan: Discharge patient  [redacted] weeks  gestation of pregnancy  NST (non-stress test) reactive   P Discharge from MAU in stable condition with DFM precautions [facility]  Allergies as of 06/21/2022   No Known Allergies      Medication List     TAKE these medications    cholecalciferol 25 MCG (1000 UNIT) tablet Commonly known as: VITAMIN D3 Take 1,000 Units by mouth daily.   prenatal multivitamin Tabs tablet Take 1 tablet by mouth daily at 12 noon.       WGabriel Carina [place]2/01/2023 3:35 AM

## 2022-06-21 NOTE — MAU Note (Signed)
.  Colleen Castillo is a 22 y.o. at [redacted]w[redacted]d here in MAU reporting decreased FM today. Has not felt any movement until in Triage when dopplered baby and could hear FM which pt felt. Denies VB or LOF. Continues to have RUQ pain which has had most of pregnancy. Describes pain as sore. Thinks may be related to baby having something pushing in her ribs.   Onset of complaint: today for no FM Pain score: 5 Vitals:   06/21/22 2051 06/21/22 2052  BP:  122/69  Pulse: 94   Resp: 17   Temp: 98.6 F (37 C)   SpO2: 98%      FHT:156 Lab orders placed from triage:  none

## 2022-06-21 NOTE — MAU Note (Signed)
Clicker given to pt to mark FM 

## 2022-07-11 ENCOUNTER — Ambulatory Visit: Payer: Medicaid Other | Attending: Maternal & Fetal Medicine

## 2022-07-11 ENCOUNTER — Ambulatory Visit: Payer: Medicaid Other | Admitting: *Deleted

## 2022-07-11 VITALS — BP 114/65 | HR 93

## 2022-07-11 DIAGNOSIS — Z3A35 35 weeks gestation of pregnancy: Secondary | ICD-10-CM | POA: Diagnosis not present

## 2022-07-11 DIAGNOSIS — O26843 Uterine size-date discrepancy, third trimester: Secondary | ICD-10-CM | POA: Insufficient documentation

## 2022-07-11 DIAGNOSIS — O99213 Obesity complicating pregnancy, third trimester: Secondary | ICD-10-CM | POA: Insufficient documentation

## 2022-07-11 DIAGNOSIS — Z3492 Encounter for supervision of normal pregnancy, unspecified, second trimester: Secondary | ICD-10-CM

## 2022-07-11 DIAGNOSIS — E669 Obesity, unspecified: Secondary | ICD-10-CM | POA: Diagnosis not present

## 2022-08-09 ENCOUNTER — Inpatient Hospital Stay (HOSPITAL_COMMUNITY)
Admission: AD | Admit: 2022-08-09 | Discharge: 2022-08-09 | Disposition: A | Discharge status: Home / Self Care | Payer: Medicaid Other | Attending: Obstetrics and Gynecology | Admitting: Obstetrics and Gynecology

## 2022-08-09 ENCOUNTER — Encounter (HOSPITAL_COMMUNITY): Payer: Self-pay | Admitting: Obstetrics and Gynecology

## 2022-08-09 DIAGNOSIS — Z3A39 39 weeks gestation of pregnancy: Secondary | ICD-10-CM | POA: Diagnosis not present

## 2022-08-09 DIAGNOSIS — Z711 Person with feared health complaint in whom no diagnosis is made: Secondary | ICD-10-CM

## 2022-08-09 DIAGNOSIS — O368131 Decreased fetal movements, third trimester, fetus 1: Secondary | ICD-10-CM | POA: Diagnosis not present

## 2022-08-09 DIAGNOSIS — O368130 Decreased fetal movements, third trimester, not applicable or unspecified: Secondary | ICD-10-CM | POA: Diagnosis not present

## 2022-08-09 DIAGNOSIS — Z3493 Encounter for supervision of normal pregnancy, unspecified, third trimester: Secondary | ICD-10-CM

## 2022-08-09 DIAGNOSIS — O471 False labor at or after 37 completed weeks of gestation: Secondary | ICD-10-CM | POA: Diagnosis not present

## 2022-08-09 NOTE — MAU Note (Signed)
..  Colleen Castillo is a 22 y.o. at [redacted]w[redacted]d here in MAU reporting: was in office today and had an NST and baby's heart rate was lower than normal (In the 120's). Felt like Doctor did not speak to her after her NST and she did not feel reassured.  Reports +FM but baby has been more "quiet"  Reports pain in her groin, pressure and cramping.    Pain score: 7/10 Vitals:   08/09/22 1937  BP: 121/60  Pulse: (!) 106  Resp: 16  Temp: 98.8 F (37.1 C)  SpO2: 98%     FHT:158

## 2022-08-09 NOTE — MAU Provider Note (Signed)
History     CSN: [id number]  Arrival date and time: 08/09/22 [id number]   Event Date/Time   First Provider Initiated Contact with Patient 08/09/22 2103      Chief Complaint  Patient presents with   Decreased Fetal Movement   Colleen Castillo , a  22 y.o. G1P0000 at [redacted]w[redacted]d presents to MAU with complaints of fetal heart rate being lower than normal. Patient states she was seen in the office today for a NST and was there for 2 hours and states she wasn't seen by a provider. She states that the baby's heart rate was down in the 120's and she states earlier she was not having as much fetal movement. She states she was not reassured that everything was fine in the office and was just worried. She denies vaginal bleeding and leaking of fluid. She reports some occasional braxton hicks contractions but states they are inconsistent. She endorses fetal movement here in MAU.          OB History     Gravida  1   Para  0   Term  0   Preterm  0   AB  0   Living  0      SAB  0   IAB  0   Ectopic  0   Multiple  0   Live Births  0           Past Medical History:  Diagnosis Date   Asthma     Past Surgical History:  Procedure Laterality Date   NO PAST SURGERIES      Family History  Problem Relation Age of Onset   Thyroid disease Mother    Breast cancer Paternal Grandmother     Social History   Tobacco Use   Smoking status: Never   Smokeless tobacco: Never  Vaping Use   Vaping Use: Never used  Substance Use Topics   Alcohol use: Never   Drug use: Never    Allergies: No Known Allergies  Medications Prior to Admission  Medication Sig Dispense Refill Last Dose   Prenatal Vit-Fe Fumarate-FA (PRENATAL MULTIVITAMIN) TABS tablet Take 1 tablet by mouth daily at 12 noon.   08/08/2022   cholecalciferol (VITAMIN D3) 25 MCG (1000 UNIT) tablet Take 1,000 Units by mouth daily.   More than a month    Review of Systems  Constitutional:  Negative for chills, fatigue and  fever.  Eyes:  Negative for pain and visual disturbance.  Respiratory:  Negative for apnea, shortness of breath and wheezing.   Cardiovascular:  Negative for chest pain and palpitations.  Gastrointestinal:  Negative for abdominal pain, constipation, diarrhea, nausea and vomiting.  Genitourinary:  Negative for difficulty urinating, dysuria, pelvic pain, vaginal bleeding, vaginal discharge and vaginal pain.  Musculoskeletal:  Negative for back pain.  Neurological:  Negative for seizures, weakness and headaches.  Psychiatric/Behavioral:  Negative for suicidal ideas.    Physical Exam   Blood pressure 121/60, pulse (!) 106, temperature 98.8 F (37.1 C), temperature source Oral, resp. rate 16, height 5\' 9"  (1.753 m), weight 125.4 kg, SpO2 98 %.  Physical Exam Vitals and nursing note reviewed.  Constitutional:      General: She is not in acute distress.    Appearance: Normal appearance.  HENT:     Head: Normocephalic.  Cardiovascular:     Rate and Rhythm: Normal rate.  Pulmonary:     Effort: Pulmonary effort is normal.  Abdominal:     Palpations: Abdomen is  soft.     Tenderness: There is no abdominal tenderness.     Comments: Pregnant. Fetal movement noted on palpation   Musculoskeletal:        General: Swelling present. Normal range of motion.     Cervical back: Normal range of motion.  Skin:    General: Skin is warm and dry.     Capillary Refill: Capillary refill takes less than 2 seconds.  Neurological:     Mental Status: She is alert and oriented to person, place, and time.  Psychiatric:        Mood and Affect: Mood normal.    FHT: 145bpm with moderate variability 15x15 accels no decels present. Cat I  Toco: UI noted.   MAU Course  Procedures  Patient placed on NST and provided a fetal kick count clicker.   MDM - NST reactive and reassuring in MAU.  - Patient hit fetal clicker > 15 times in MAU - Plan for discharge   Assessment and Plan   1. Physically well but  worried   2. [redacted] weeks gestation of pregnancy   3. Movement of fetus present during pregnancy in third trimester    - Discussed with patient and FOB 3rd trimester fetal movement expectations. Reviewed fetal kick counts and form for counting provided.  - Also reviewed normal Fetal heart rates in 3rd trimesters and the physiologic changes that occur in utero towards the end of pregnancy.  - FHT reactive and reassuring at time of discharge.  - Worsening signs and return precautions reviewed with patient and FOB. - Routine labor precautions reviewed.  - Patient discharged home in stable condition and may return to MAU as needed.   Jacquiline Doe, MSN CNM  08/09/2022, 9:03 PM

## 2022-08-13 ENCOUNTER — Telehealth (HOSPITAL_COMMUNITY): Payer: Self-pay | Admitting: *Deleted

## 2022-08-13 NOTE — Telephone Encounter (Signed)
Preadmission screen  

## 2022-08-14 ENCOUNTER — Encounter (HOSPITAL_COMMUNITY): Payer: Self-pay | Admitting: Obstetrics & Gynecology

## 2022-08-14 ENCOUNTER — Other Ambulatory Visit: Payer: Self-pay

## 2022-08-14 ENCOUNTER — Inpatient Hospital Stay (HOSPITAL_COMMUNITY)
Admission: AD | Admit: 2022-08-14 | Discharge: 2022-08-17 | DRG: 788 | Disposition: A | Discharge status: Home / Self Care | Payer: Medicaid Other | Attending: Obstetrics & Gynecology | Admitting: Obstetrics & Gynecology

## 2022-08-14 DIAGNOSIS — O480 Post-term pregnancy: Secondary | ICD-10-CM | POA: Diagnosis present

## 2022-08-14 DIAGNOSIS — O99820 Streptococcus B carrier state complicating pregnancy: Secondary | ICD-10-CM | POA: Diagnosis not present

## 2022-08-14 DIAGNOSIS — Z3A40 40 weeks gestation of pregnancy: Secondary | ICD-10-CM | POA: Diagnosis not present

## 2022-08-14 DIAGNOSIS — Z3490 Encounter for supervision of normal pregnancy, unspecified, unspecified trimester: Principal | ICD-10-CM | POA: Diagnosis present

## 2022-08-14 DIAGNOSIS — O99824 Streptococcus B carrier state complicating childbirth: Secondary | ICD-10-CM | POA: Diagnosis present

## 2022-08-14 DIAGNOSIS — O99214 Obesity complicating childbirth: Secondary | ICD-10-CM | POA: Diagnosis present

## 2022-08-14 LAB — CBC
HCT: 39 % (ref 36.0–46.0)
Hemoglobin: 12.8 g/dL (ref 12.0–15.0)
MCH: 30 pg (ref 26.0–34.0)
MCHC: 32.8 g/dL (ref 30.0–36.0)
MCV: 91.5 fL (ref 80.0–100.0)
Platelets: 204 10*3/uL (ref 150–400)
RBC: 4.26 MIL/uL (ref 3.87–5.11)
RDW: 13 % (ref 11.5–15.5)
WBC: 6.6 10*3/uL (ref 4.0–10.5)
nRBC: 0 % (ref 0.0–0.2)

## 2022-08-14 LAB — TYPE AND SCREEN
ABO/RH(D): A POS
Antibody Screen: NEGATIVE

## 2022-08-14 MED ORDER — LACTATED RINGERS IV SOLN: INTRAVENOUS | Status: DC

## 2022-08-14 MED ORDER — MISOPROSTOL 50MCG HALF TABLET
50.0000 ug | ORAL_TABLET | Freq: Once | ORAL | Status: AC
  Administered 2022-08-14: 50 ug via BUCCAL
  Filled 2022-08-14: qty 1

## 2022-08-14 MED ORDER — PENICILLIN G POT IN DEXTROSE 60000 UNIT/ML IV SOLN: 3.0000 10*6.[IU] | INTRAVENOUS | Status: DC

## 2022-08-14 MED ORDER — SODIUM CHLORIDE 0.9 % IV SOLN
5.0000 10*6.[IU] | Freq: Once | INTRAVENOUS | Status: DC
  Administered 2022-08-15: 5 10*6.[IU] via INTRAVENOUS
  Filled 2022-08-14: qty 5

## 2022-08-14 MED ORDER — OXYTOCIN-SODIUM CHLORIDE 30-0.9 UT/500ML-% IV SOLN: 2.5000 [IU]/h | INTRAVENOUS | Status: DC

## 2022-08-14 MED ORDER — MISOPROSTOL 25 MCG QUARTER TABLET
25.0000 ug | ORAL_TABLET | Freq: Once | ORAL | Status: AC
  Administered 2022-08-14: 25 ug via VAGINAL
  Filled 2022-08-14 (×2): qty 1

## 2022-08-14 MED ORDER — LACTATED RINGERS IV SOLN
500.0000 mL | INTRAVENOUS | Status: DC | PRN
  Administered 2022-08-15: 1000 mL via INTRAVENOUS

## 2022-08-14 MED ORDER — OXYTOCIN BOLUS FROM INFUSION: 333.0000 mL | Freq: Once | INTRAVENOUS | Status: DC

## 2022-08-14 MED ORDER — ONDANSETRON HCL 4 MG/2ML IJ SOLN: 4.0000 mg | Freq: Four times a day (QID) | INTRAMUSCULAR | Status: DC | PRN

## 2022-08-14 MED ORDER — MISOPROSTOL 25 MCG QUARTER TABLET: 25.0000 ug | ORAL_TABLET | ORAL | Status: DC | PRN

## 2022-08-14 MED ORDER — OXYTOCIN-SODIUM CHLORIDE 30-0.9 UT/500ML-% IV SOLN: 1.0000 m[IU]/min | INTRAVENOUS | Status: DC

## 2022-08-14 MED ORDER — ACETAMINOPHEN 325 MG PO TABS: 650.0000 mg | ORAL_TABLET | ORAL | Status: DC | PRN

## 2022-08-14 MED ORDER — LIDOCAINE HCL (PF) 1 % IJ SOLN: 30.0000 mL | INTRAMUSCULAR | Status: DC | PRN

## 2022-08-14 MED ORDER — SOD CITRATE-CITRIC ACID 500-334 MG/5ML PO SOLN
30.0000 mL | ORAL | Status: DC | PRN
  Administered 2022-08-15: 30 mL via ORAL
  Filled 2022-08-14: qty 30

## 2022-08-14 MED ORDER — TERBUTALINE SULFATE 1 MG/ML IJ SOLN
0.2500 mg | Freq: Once | INTRAMUSCULAR | Status: AC | PRN
  Administered 2022-08-15: 0.25 mg via SUBCUTANEOUS
  Filled 2022-08-14: qty 1

## 2022-08-14 NOTE — Plan of Care (Signed)

## 2022-08-14 NOTE — MAU Provider Note (Signed)
Colleen Castillo is a 22 y.o. female G1P0000 @ [redacted]w[redacted]d.  Patient was sent over from the office for ? Variable decelerations. She was sent her for extended monitoring.   Upon arrival patient with a category 1 fetal tracing.   @ [id number] she had prolonged deceleration down to 115 and back to baseline with good variability.    Recommended admission with Dr. Alesia Richards who would like to come to MAU to see the patient and discuss plan. Care turned over to Dr. Alesia Richards.   Lezlie Lye, NP 08/14/2022 6:24 PM

## 2022-08-14 NOTE — MAU Note (Signed)
...  Colleen Castillo is a 22 y.o. at [redacted]w[redacted]d here in MAU reporting: She reports she was sent over from the office for further evaluation. She reports she was told that she was tracing contractions regularly. She reports she began feeling CTX a few hours prior to her appointment but they were irregular. She reports they have been every 2-3 minutes. She reports her doctor told her it was urgent she came in. Denies VB or LOF. +FM.   Cervix was closed last week in office.  Pain score:  7/10 lower abdomen  FHT: 130 initial external

## 2022-08-14 NOTE — H&P (Signed)
Colleen Castillo is a 22 y.o. female presenting for induction of labor for abnormal fetal heart tracing. Patient was seen in office for NST and found to have  some variable decelerations.  She was sent to MAU for further monitoring and NST at MAU showed a prolonged deceleration.  She was kept for induction of labor for abnormal fetal heart tracing. G1P0 at 40 weeks 4 days EGA, LMP 11/07/2022 , EDC: 08/10/22 by first trimester ultrasound.  She has no complaints.  She denies contractions or vaginal bleeding or leakage of fluid. With normal fetal movement.   OB History     Gravida  1   Para  0   Term  0   Preterm  0   AB  0   Living  0      SAB  0   IAB  0   Ectopic  0   Multiple  0   Live Births  0          Past Medical History:  Diagnosis Date   Asthma    Past Surgical History:  Procedure Laterality Date   NO PAST SURGERIES     Family History: family history includes Breast cancer in her paternal grandmother; Healthy in her father; Thyroid disease in her mother. Social History:  reports that she has never smoked. She has never used smokeless tobacco. She reports that she does not drink alcohol and does not use drugs.     Maternal Diabetes: No Genetic Screening: Normal Maternal Ultrasounds/Referrals: Normal Fetal Ultrasounds or other Referrals:  None Maternal Substance Abuse:  No Significant Maternal Medications:  None Significant Maternal Lab Results: Group B Strep positive.  Number of Prenatal Visits:greater than 3 verified prenatal visits Other Comments:  None  Review of Systems Constitutional: Denies fevers/chills Cardiovascular: Denies chest pain or palpitations Pulmonary: Denies coughing or wheezing Gastrointestinal: Denies nausea, vomiting or diarrhea Genitourinary: Denies pelvic pain, unusual vaginal bleeding, unusual vaginal discharge, dysuria, urgency or frequency.  Musculoskeletal: Denies muscle or joint aches and pain.  Neurology: Denies abnormal  sensations such as tingling or numbness.   History Dilation: 1 Effacement (%): 50 Station: -2 Exam by:: F. Morris, RNC Blood pressure 129/75, pulse 99, temperature 97.6 F (36.4 C), temperature source Oral, resp. rate 18, height 5\' 8"  (1.727 m), weight 124.9 kg, SpO2 100 %. Exam Physical Exam  Constitutional: She is oriented to person, place, and time. She appears well-developed and well-nourished.  HENT:  Head: Normocephalic and atraumatic.  Neck: Normal range of motion.  Cardiovascular: Normal rate, regular rhythm and normal heart sounds.   Respiratory: Effort normal and breath sounds normal.  GI: Soft. Bowel sounds are normal. Genitorurinary: Gravid uterus, appropriate for gestational age.   EFW by Le[place] 6.5 to 7 lbs. Adequate pelvis.    Neurological: She is alert and oriented to person, place, and time.  Skin: Skin is warm and dry.  Psychiatric: She has a normal mood and affect. Her behavior is normal.    NST: 130 baseline, moderate variability, reactive. Prolonged deceleration 10 110 for 3 minutes with return to baseline.   Prenatal labs: ABO, Rh: A/Positive/-- (09/07 0000) Antibody: Negative (09/07 0000) Rubella: Immune (09/07 0000) RPR: Nonreactive (09/07 0000)  HBsAg: Negative (09/07 0000)  HIV: Non-reactive (09/07 0000)  GBS:   Positive. .   Assessment/Plan: 22 y/o G1P0 at 40 weeks 4 days EGA here for induction of labor for abnormal fetal heart tracing,   - Admit to [facility] and Delivery as per  admit orders - I discussed with patient risks, benefits and alternatives of labor induction including risks of cesarean delivery.  We discussed risks of induction agents including effects on fetal heart beat, contraction pattern and need for close monitoring.  Patient expressed understanding of all this and desired to proceed with the induction.   -Will start with buccal and vaginal cytotec regimen.    - Penicillin for GBS prophylaxis.   Archie Endo, MD.   08/14/2022,  7:18 PM

## 2022-08-14 NOTE — Progress Notes (Signed)
Patient ID: Colleen Castillo, female   DOB: 2000/06/16, 22 y.o.   MRN: [id number] Chart reviewed remotely EFM: 120 baseline, moderate variability, reactive.  No decelerations.  TOCO: No contractions Patient has already received vaginal and buccal cytotec.   Dr. Waymon Amato.

## 2022-08-15 ENCOUNTER — Encounter (HOSPITAL_COMMUNITY)
Admission: AD | Disposition: A | Discharge status: Home / Self Care | Payer: Self-pay | Source: Home / Self Care | Attending: Obstetrics & Gynecology

## 2022-08-15 ENCOUNTER — Other Ambulatory Visit: Payer: Self-pay

## 2022-08-15 ENCOUNTER — Encounter (HOSPITAL_COMMUNITY): Payer: Self-pay | Admitting: Obstetrics & Gynecology

## 2022-08-15 ENCOUNTER — Inpatient Hospital Stay (HOSPITAL_COMMUNITY): Payer: Medicaid Other | Admitting: Anesthesiology

## 2022-08-15 DIAGNOSIS — Z3A40 40 weeks gestation of pregnancy: Secondary | ICD-10-CM

## 2022-08-15 DIAGNOSIS — O99214 Obesity complicating childbirth: Secondary | ICD-10-CM

## 2022-08-15 DIAGNOSIS — O99820 Streptococcus B carrier state complicating pregnancy: Secondary | ICD-10-CM

## 2022-08-15 LAB — RPR: RPR Ser Ql: NONREACTIVE

## 2022-08-15 SURGERY — Surgical Case
Anesthesia: Spinal

## 2022-08-15 MED ORDER — OXYCODONE HCL 5 MG PO TABS
5.0000 mg | ORAL_TABLET | ORAL | Status: DC | PRN
  Administered 2022-08-15 – 2022-08-17 (×3): 5 mg via ORAL
  Filled 2022-08-15 (×4): qty 1

## 2022-08-15 MED ORDER — MORPHINE SULFATE (PF) 0.5 MG/ML IJ SOLN
INTRAMUSCULAR | Status: AC
  Filled 2022-08-15: qty 10

## 2022-08-15 MED ORDER — ACETAMINOPHEN 500 MG PO TABS
1000.0000 mg | ORAL_TABLET | Freq: Four times a day (QID) | ORAL | Status: DC
  Administered 2022-08-15 – 2022-08-17 (×7): 1000 mg via ORAL
  Filled 2022-08-15 (×9): qty 2

## 2022-08-15 MED ORDER — OXYTOCIN-SODIUM CHLORIDE 30-0.9 UT/500ML-% IV SOLN
INTRAVENOUS | Status: DC | PRN
  Administered 2022-08-15: 30 [IU] via INTRAVENOUS

## 2022-08-15 MED ORDER — SODIUM CHLORIDE 0.9 % IR SOLN
Status: DC | PRN
  Administered 2022-08-15: 1

## 2022-08-15 MED ORDER — KETOROLAC TROMETHAMINE 30 MG/ML IJ SOLN
30.0000 mg | Freq: Four times a day (QID) | INTRAMUSCULAR | Status: AC
  Administered 2022-08-15 (×2): 30 mg via INTRAVENOUS
  Filled 2022-08-15 (×2): qty 1

## 2022-08-15 MED ORDER — PRENATAL MULTIVITAMIN CH
1.0000 | ORAL_TABLET | Freq: Every day | ORAL | Status: DC
  Administered 2022-08-15 – 2022-08-17 (×3): 1 via ORAL
  Filled 2022-08-15 (×3): qty 1

## 2022-08-15 MED ORDER — ONDANSETRON HCL 4 MG/2ML IJ SOLN: 4.0000 mg | Freq: Once | INTRAMUSCULAR | Status: DC | PRN

## 2022-08-15 MED ORDER — SENNOSIDES-DOCUSATE SODIUM 8.6-50 MG PO TABS
2.0000 | ORAL_TABLET | Freq: Every day | ORAL | Status: DC
  Administered 2022-08-17: 2 via ORAL
  Filled 2022-08-15: qty 2

## 2022-08-15 MED ORDER — OXYCODONE HCL 5 MG PO TABS: 5.0000 mg | ORAL_TABLET | Freq: Once | ORAL | Status: DC | PRN

## 2022-08-15 MED ORDER — COCONUT OIL OIL: 1.0000 | TOPICAL_OIL | Status: DC | PRN

## 2022-08-15 MED ORDER — ACETAMINOPHEN 160 MG/5ML PO SOLN: 325.0000 mg | ORAL | Status: DC | PRN

## 2022-08-15 MED ORDER — DIPHENHYDRAMINE HCL 25 MG PO CAPS: 25.0000 mg | ORAL_CAPSULE | Freq: Four times a day (QID) | ORAL | Status: DC | PRN

## 2022-08-15 MED ORDER — ACETAMINOPHEN 325 MG PO TABS: 325.0000 mg | ORAL_TABLET | ORAL | Status: DC | PRN

## 2022-08-15 MED ORDER — IBUPROFEN 600 MG PO TABS
600.0000 mg | ORAL_TABLET | Freq: Four times a day (QID) | ORAL | Status: DC
  Administered 2022-08-15 – 2022-08-17 (×8): 600 mg via ORAL
  Filled 2022-08-15 (×8): qty 1

## 2022-08-15 MED ORDER — OXYCODONE HCL 5 MG/5ML PO SOLN: 5.0000 mg | Freq: Once | ORAL | Status: DC | PRN

## 2022-08-15 MED ORDER — ENOXAPARIN SODIUM 60 MG/0.6ML IJ SOSY
60.0000 mg | PREFILLED_SYRINGE | INTRAMUSCULAR | Status: DC
  Administered 2022-08-16 – 2022-08-17 (×2): 60 mg via SUBCUTANEOUS
  Filled 2022-08-15 (×2): qty 0.6

## 2022-08-15 MED ORDER — HYDROMORPHONE HCL 1 MG/ML IJ SOLN: 0.2000 mg | INTRAMUSCULAR | Status: DC | PRN

## 2022-08-15 MED ORDER — FENTANYL CITRATE (PF) 100 MCG/2ML IJ SOLN: 25.0000 ug | INTRAMUSCULAR | Status: DC | PRN

## 2022-08-15 MED ORDER — MEPERIDINE HCL 25 MG/ML IJ SOLN: 6.2500 mg | INTRAMUSCULAR | Status: DC | PRN

## 2022-08-15 MED ORDER — BUPIVACAINE IN DEXTROSE 0.75-8.25 % IT SOLN
INTRATHECAL | Status: DC | PRN
  Administered 2022-08-15: 1.6 mL via INTRATHECAL

## 2022-08-15 MED ORDER — OXYTOCIN-SODIUM CHLORIDE 30-0.9 UT/500ML-% IV SOLN
2.5000 [IU]/h | INTRAVENOUS | Status: AC
  Administered 2022-08-15: 2.5 [IU]/h via INTRAVENOUS
  Filled 2022-08-15: qty 500

## 2022-08-15 MED ORDER — TRANEXAMIC ACID-NACL 1000-0.7 MG/100ML-% IV SOLN
1000.0000 mg | INTRAVENOUS | Status: AC
  Administered 2022-08-15: 1000 mg via INTRAVENOUS

## 2022-08-15 MED ORDER — FENTANYL CITRATE (PF) 100 MCG/2ML IJ SOLN
INTRAMUSCULAR | Status: AC
  Filled 2022-08-15: qty 2

## 2022-08-15 MED ORDER — MORPHINE SULFATE (PF) 0.5 MG/ML IJ SOLN
INTRAMUSCULAR | Status: DC | PRN
  Administered 2022-08-15: 150 ug via INTRATHECAL

## 2022-08-15 MED ORDER — PHENYLEPHRINE HCL-NACL 20-0.9 MG/250ML-% IV SOLN
INTRAVENOUS | Status: DC | PRN
  Administered 2022-08-15: 60 ug/min via INTRAVENOUS

## 2022-08-15 MED ORDER — ZOLPIDEM TARTRATE 5 MG PO TABS: 5.0000 mg | ORAL_TABLET | Freq: Every evening | ORAL | Status: DC | PRN

## 2022-08-15 MED ORDER — DIBUCAINE (PERIANAL) 1 % EX OINT: 1.0000 | TOPICAL_OINTMENT | CUTANEOUS | Status: DC | PRN

## 2022-08-15 MED ORDER — SIMETHICONE 80 MG PO CHEW
80.0000 mg | CHEWABLE_TABLET | ORAL | Status: DC | PRN
  Administered 2022-08-15: 80 mg via ORAL
  Filled 2022-08-15: qty 1

## 2022-08-15 MED ORDER — MENTHOL 3 MG MT LOZG: 1.0000 | LOZENGE | OROMUCOSAL | Status: DC | PRN

## 2022-08-15 MED ORDER — WITCH HAZEL-GLYCERIN EX PADS: 1.0000 | MEDICATED_PAD | CUTANEOUS | Status: DC | PRN

## 2022-08-15 MED ORDER — FENTANYL CITRATE (PF) 100 MCG/2ML IJ SOLN
INTRAMUSCULAR | Status: DC | PRN
  Administered 2022-08-15: 15 ug via INTRATHECAL

## 2022-08-15 MED ORDER — CEFAZOLIN IN SODIUM CHLORIDE 3-0.9 GM/100ML-% IV SOLN
3.0000 g | INTRAVENOUS | Status: AC
  Administered 2022-08-15: 3 g via INTRAVENOUS

## 2022-08-15 MED ORDER — ACETAMINOPHEN 10 MG/ML IV SOLN
INTRAVENOUS | Status: DC | PRN
  Administered 2022-08-15: 1000 mg via INTRAVENOUS

## 2022-08-15 SURGICAL SUPPLY — 35 items
ADH SKN CLS APL DERMABOND .7 (GAUZE/BANDAGES/DRESSINGS)
APL PRP STRL LF DISP 70% ISPRP (MISCELLANEOUS) ×2
APL SKNCLS STERI-STRIP NONHPOA (GAUZE/BANDAGES/DRESSINGS) ×1
BENZOIN TINCTURE PRP APPL 2/3 (GAUZE/BANDAGES/DRESSINGS) IMPLANT
CHLORAPREP W/TINT 26 (MISCELLANEOUS) ×4 IMPLANT
CLAMP UMBILICAL CORD (MISCELLANEOUS) ×2 IMPLANT
DERMABOND ADVANCED .7 DNX12 (GAUZE/BANDAGES/DRESSINGS) IMPLANT
DRAPE C SECTION CLR SCREEN (DRAPES) ×2 IMPLANT
DRSG OPSITE POSTOP 4X10 (GAUZE/BANDAGES/DRESSINGS) ×2 IMPLANT
ELECT REM PT RETURN 9FT ADLT (ELECTROSURGICAL) ×1
ELECTRODE REM PT RTRN 9FT ADLT (ELECTROSURGICAL) ×2 IMPLANT
GAUZE SPONGE 4X4 12PLY STRL LF (GAUZE/BANDAGES/DRESSINGS) IMPLANT
GLOVE BIOGEL PI IND STRL 7.0 (GLOVE) ×4 IMPLANT
GLOVE SURG SS PI 6.5 STRL IVOR (GLOVE) ×2 IMPLANT
GOWN STRL REUS W/TWL LRG LVL3 (GOWN DISPOSABLE) ×4 IMPLANT
NDL KEITH (NEEDLE) ×2 IMPLANT
NEEDLE KEITH (NEEDLE) ×1 IMPLANT
NS IRRIG 1000ML POUR BTL (IV SOLUTION) ×2 IMPLANT
PACK C SECTION WH (CUSTOM PROCEDURE TRAY) ×2 IMPLANT
PAD OB MATERNITY 4.3X12.25 (PERSONAL CARE ITEMS) ×2 IMPLANT
RTRCTR C-SECT PINK 25CM LRG (MISCELLANEOUS) ×2 IMPLANT
STRIP CLOSURE SKIN 1/2X4 (GAUZE/BANDAGES/DRESSINGS) IMPLANT
SUT CHROMIC 1 CTX 36 (SUTURE) IMPLANT
SUT CHROMIC 2 0 CT 1 (SUTURE) ×2 IMPLANT
SUT PLAIN 1 NONE 54 (SUTURE) IMPLANT
SUT PLAIN 2 0 (SUTURE)
SUT PLAIN 2 0 XLH (SUTURE) IMPLANT
SUT PLAIN ABS 2-0 CT1 27XMFL (SUTURE) IMPLANT
SUT VIC AB 0 CTX 36 (SUTURE) ×1
SUT VIC AB 0 CTX36XBRD ANBCTRL (SUTURE) ×2 IMPLANT
SUT VIC AB 1 CTX 36 (SUTURE) ×3
SUT VIC AB 1 CTX36XBRD ANBCTRL (SUTURE) ×4 IMPLANT
TOWEL OR 17X24 6PK STRL BLUE (TOWEL DISPOSABLE) ×2 IMPLANT
TRAY FOLEY W/BAG SLVR 14FR LF (SET/KITS/TRAYS/PACK) ×2 IMPLANT
WATER STERILE IRR 1000ML POUR (IV SOLUTION) ×2 IMPLANT

## 2022-08-15 NOTE — Plan of Care (Signed)

## 2022-08-15 NOTE — Lactation Note (Signed)
This note was copied from a baby's chart. Lactation Consultation Note  Patient Name: Colleen Castillo [id number] Date: 08/15/2022 Age:23 hours   P1, GA [redacted]w[redacted]d, cesarean delivery  Mother and family sleeping. Will return.   Stana Bunting M 08/15/2022, 9:07 AM

## 2022-08-15 NOTE — Transfer of Care (Signed)
Immediate Anesthesia Transfer of Care Note  Patient: Colleen Castillo  Procedure(s) Performed: CESAREAN SECTION  Patient Location: PACU  Anesthesia Type:Spinal  Level of Consciousness: awake, alert , and oriented  Airway & Oxygen Therapy: Patient Spontanous Breathing  Post-op Assessment: Report given to RN and Post -op Vital signs reviewed and stable  Post vital signs: Reviewed and stable  Last Vitals:  Vitals Value Taken Time  BP 121/67  08/15/22 0405  Temp    Pulse 98 08/15/22 0406  Resp 21 08/15/22 0406  SpO2 98 % 08/15/22 0406  Vitals shown include unvalidated device data.  Last Pain:  Vitals:   08/15/22 0200  TempSrc:   PainSc: 0-No pain         Complications: No notable events documented.

## 2022-08-15 NOTE — Anesthesia Procedure Notes (Signed)
Spinal  Patient location during procedure: OR Start time: 08/15/2022 2:35 AM End time: 08/15/2022 2:40 AM Reason for block: surgical anesthesia Staffing Anesthesiologist: Janeece Riggers, MD Performed by: Janeece Riggers, MD Authorized by: Janeece Riggers, MD   Preanesthetic Checklist Completed: patient identified, IV checked, site marked, risks and benefits discussed, surgical consent, monitors and equipment checked, pre-op evaluation and timeout performed Spinal Block Patient position: sitting Prep: DuraPrep Patient monitoring: heart rate, cardiac monitor, continuous pulse ox and blood pressure Approach: midline Location: L3-4 Injection technique: single-shot Needle Needle type: Sprotte  Needle gauge: 24 G Needle length: 9 cm Assessment Sensory level: T4 Events: CSF return

## 2022-08-15 NOTE — Progress Notes (Signed)
Subjective: Postpartum Day 0: Cesarean Delivery Patient reports tolerating PO, pain well controlled and denies n/v. Tolerating breakfast this morning. Foley in place.  Objective: Vital signs in last 24 hours: Temp:  [97.6 F (36.4 C)-99 F (37.2 C)] 98 F (36.7 C) (04/03 0815) Pulse Rate:  [68-112] 68 (04/03 0815) Resp:  [12-22] 18 (04/03 0815) BP: (90-132)/(44-91) 130/73 (04/03 0815) SpO2:  [96 %-100 %] 100 % (04/03 0815) Weight:  [124.9 kg] 124.9 kg (04/02 2105)  Physical Exam:  General: alert, cooperative, and no distress Lochia: appropriate Uterine Fundus: firm Incision: dressing intact, quarter size spot of old dry blood on honey comb dressing  DVT Evaluation: No evidence of DVT seen on physical exam.  Recent Labs    08/14/22 1939  HGB 12.8  HCT 39.0    Assessment/Plan: Status post Cesarean section. Doing well postoperatively.  Continue current care.  Josefine Class, MD 08/15/2022, 11:41 AM

## 2022-08-15 NOTE — Op Note (Addendum)
Patient: Latissha Smolinski DOB: 03/03/2001 MRN:  [id number]  DATE OF SURGERY: 08/15/2022   PREOP DIAGNOSIS:  1. 40 week 5 day EGA IUP. 2. Induction of labor for abnormal fetal heart tracing.  3. Abnormal fetal heart tracing with recurrent variables and late decelerations.   POSTOP DIAGNOSIS: Same as above.  PROCEDURE: Primary low uterine segment transverse cesarean section via Pfannenstiel incision.     SURGEON: Dr.  Waymon Amato  ASSISTANT: Dr. Gifford Shave  SURGEON ATTESTATION: An experienced assistant was required given the standard of surgical care given the complexity of the case.  This assistant was needed for exposure, dissection, suctioning, retraction, instrument exchange,  assisting with delivery with administration of fundal pressure, and for overall help during the procedure.   ANESTHESIA: Spinal  COMPLICATIONS: None  FINDINGS: Viable female infant in cephalic presentation, LOT, weight pending, Apgar scores of 9 and 9. Normal uterus and fallopian tubes and ovaries bilaterally.    EBL:  507 cc  IV FLUID:   1700 cc LR   URINE OUTPUT: 100 cc clear urine  INDICATIONS:  22 y/o G1P0 at 40 weeks 5 days EGA who presented for induction of labor for abnormal fetal heart tracing.  Recurrent variable and late fetal decelerations were noted that persisted despite intrauterine resuscitation.   A cesarean delivery was called for abnormal fetal heart tracing and remote from delivery at 1 cm dilation.  She was consented for the procedure after explaining risks benefits and alternatives of the procedure.    PROCEDURE:   She was taken to the operating room where her spinal anesthesia was found to be adequate. She was prepped with chloraprep abdominally and betadine vaginally.  She was draped in the usual sterile fashion and a Foley catheter was placed. She received 3 g of IV Ancef preoperatively and 1 gram tranexamic acid at incision time.  A Pfannenstiel incision was made with the scalpel and  the incision extended through the subcutaneous layer and also the fascia with the bovie. Small perforators in the subcutaneous layer were contained with the Bovie. The fascia was nicked in the midline and then was further separated from the rectus muscles bilaterally using Mayo scissors. Kochers were placed inferiorly and then superiorly to allow further separation of fascia from the rectus muscles.  Small bleeders on the rectus muscle were contained with the bovie.  The peritoneal cavity was entered bluntly with the fingers. The Alexis retractor was placed in. The bladder flap was created using Metzenbaum scissors.   The uterus was incised with a scalpel and the incision extended bluntly bilaterally with fingers and bandage scisors. Membranes were ruptured and moderate meconium stained amniotic fluid was noted.  The head then the rest of the body was then delivered with abdominal pressure, position of baby was DOA and nuchal cord was reduced before delivery.  She delivered a viable female infant, apgar scores 8, 9.  The cord was clamped and cut at less than 1 minute.  Cord blood was collected.    The uterus was not exteriorized.  The edges of the uterus was grasped with T clamps. The placenta was delivered with gentle traction on the umbilical cord. The uterus was cleared of clots and debris with a lap.  The uterine incision was closed with #1 Vicryl in a running locked stitch. An imbricating layer of the same stitch was placed over the initial closure.  Irrigation was applied and suctioned out. Excellent hemostasis was noted over the incision.  The peritoneum and then rectus  muscles were then reapproximated using chromic suture.  Fascia was closed using 0 Vicryl in a running stitch. The subcutaneous layer was irrigated and suctioned out. Small perforators were contained with the bovie.  The subcutaneous layer was closed using 2-0 plain in interrupted stitches. The skin was closed using 4-0 Vicryl on the Montebello  needle. Benzocaine and steri strips were applied.  Honeycomb was then applied. The patient was then cleaned and she was taken to the recovery room with her baby in stable conditions.   SPECIMEN: Placenta to pathology, umbilical cord blood to lab.   DISPOSITION: TO PACU, STABLE.

## 2022-08-15 NOTE — Anesthesia Preprocedure Evaluation (Signed)
Anesthesia Evaluation  Patient identified by MRN, date of birth, ID band Patient awake    Reviewed: Allergy & Precautions, H&P , NPO status , Patient's Chart, lab work & pertinent test results  Airway Mallampati: II  TM Distance: >3 FB Neck ROM: Full    Dental no notable dental hx. (+) Teeth Intact, Dental Advisory Given   Pulmonary neg pulmonary ROS   Pulmonary exam normal breath sounds clear to auscultation       Cardiovascular negative cardio ROS Normal cardiovascular exam Rhythm:Regular Rate:Normal     Neuro/Psych negative neurological ROS  negative psych ROS   GI/Hepatic negative GI ROS, Neg liver ROS,,,  Endo/Other    Morbid obesity  Renal/GU negative Renal ROS     Musculoskeletal   Abdominal   Peds  Hematology negative hematology ROS (+)   Anesthesia Other Findings   Reproductive/Obstetrics (+) Pregnancy                             Anesthesia Physical Anesthesia Plan  ASA: 3 and emergent  Anesthesia Plan: Spinal   Post-op Pain Management: Minimal or no pain anticipated   Induction: Intravenous  PONV Risk Score and Plan: 2 and Treatment may vary due to age or medical condition  Airway Management Planned: Natural Airway and Simple Face Mask  Additional Equipment: None  Intra-op Plan:   Post-operative Plan:   Informed Consent: I have reviewed the patients History and Physical, chart, labs and discussed the procedure including the risks, benefits and alternatives for the proposed anesthesia with the patient or authorized representative who has indicated his/her understanding and acceptance.       Plan Discussed with: Anesthesiologist  Anesthesia Plan Comments:         Anesthesia Quick Evaluation

## 2022-08-15 NOTE — Lactation Note (Signed)
This note was copied from a baby's chart. Lactation Consultation Note  Patient Name: Colleen Castillo [id number] Date: 08/15/2022 Age:23 hours  Reason for consult: 1st time breastfeeding;Initial assessment;Term;Breastfeeding assistance;Primapara  Mother alert and receptive to [facility] visit. Mother was able to awaken baby with minimal stimulation. He has been resting skin to skin. Mother was taught hand expression and colostrum was easily expressed prior to latch to entice baby. Basic breastfeeding education regarding position, attachment and pillows for support was discussed. Infant latched well and feeding with wide gape, rhythmic pattern and bursts of swallows. Mother is massaging breast during the feeding to aid in milk transfer. Mother denies discomfort and asking appropriate questions.   Encourage mother to watch infant for feeding cues, skin to skin if not cueing, call for assist as needed. Advised about anticipated cluster feeding and the norm of baby wanting feed 8-12 times/24 hrs once cluster feeding or > 24 hrs old.   Mom made aware of O/P services, breastfeeding support groups, community resources, and our phone # for post-discharge questions.     Maternal Data Has patient been taught Hand Expression?: Yes  Feeding Mother's Current Feeding Choice: Breast Milk  LATCH Score Latch: Grasps breast easily, tongue down, lips flanged, rhythmical sucking.  Audible Swallowing: A few with stimulation  Type of Nipple: Everted at rest and after stimulation  Comfort (Breast/Nipple): Soft / non-tender  Hold (Positioning): Assistance needed to correctly position infant at breast and maintain latch.  LATCH Score: 8   Interventions Interventions: Breast feeding basics reviewed;Assisted with latch;Skin to skin;Breast massage;Hand express;Breast compression;Support pillows;Position options;Education;LC Services brochure  Consult Status Consult Status: Follow-up Date: 08/16/22 Follow-up type:  In-patient   Stana Bunting M 08/15/2022, 4:46 PM

## 2022-08-15 NOTE — Anesthesia Postprocedure Evaluation (Signed)
Anesthesia Post Note  Patient: Colleen Castillo  Procedure(s) Performed: [facility]     Patient location during evaluation: PACU Anesthesia Type: Spinal Level of consciousness: awake Pain management: pain level controlled Vital Signs Assessment: post-procedure vital signs reviewed and stable Respiratory status: spontaneous breathing Cardiovascular status: stable Postop Assessment: no apparent nausea or vomiting Anesthetic complications: no  No notable events documented.  Last Vitals:  Vitals:   08/15/22 0718 08/15/22 0815  BP: 132/77 130/73  Pulse: 88 68  Resp:  18  Temp: 36.8 C 36.7 C  SpO2: 99% 100%    Last Pain:  Vitals:   08/15/22 0815  TempSrc: Oral  PainSc:    Pain Goal:                   Huston Foley

## 2022-08-15 NOTE — Progress Notes (Addendum)
Vonette Malczewski is a 22 y.o. G1P0000 at [redacted]w[redacted]d admitted for induction of labor for abnormal fetal heart tracing,  Called by RN to evaluate the fetal heart tracing.   Subjective:  Patient feels some contractions, mild intensity.    Objective: BP (!) 102/44   Pulse 72   Temp 99 F (37.2 C) (Oral)   Resp 17   Ht 5\' 8"  (1.727 m)   Wt 124.9 kg   SpO2 100%   BMI 41.87 kg/m  No intake/output data recorded. No intake/output data recorded.    08/15/2022    1:32 AM 08/15/2022    1:31 AM 08/15/2022    1:02 AM  Vitals with BMI  Systolic   [id number]  Diastolic   44  Pulse 87 87 72    FHT:  FHR: 150 bpm, variability: moderate,  accelerations:  Abscent,  decelerations:  Present Prolonged, variables. UC:   irregular, every 2 to 4 minutes SVE:   Dilation: 1 Effacement (%): 50 Station: -3 Exam by:: Amita S  Labs: Lab Results  Component Value Date   WBC 6.6 08/14/2022   HGB 12.8 08/14/2022   HCT 39.0 08/14/2022   MCV 91.5 08/14/2022   PLT 204 08/14/2022   Assessment / Plan: 22 y.o. G1P0000 at [redacted]w[redacted]d admitted for induction of labor for abnormal fetal heart tracing Labor:  s/p buccal and vaginal cytotec.   Preeclampsia:   none Fetal Wellbeing:  Category II. Patient is receiving IV fluids bolus, positional changes and terbutaline was administered. Continue with close monitoring of fetal heart tracing. Management options discussed with patient including a cesarean delivery versus continued fetal resuscitative efforts.  Patient does not desire a cesarean delivery at this time. She desires continued fetal resuscitative efforts.   Pain Control:  None currently.  I/D:   GBS positive on penicillin.  Anticipated MOD:   Unsure.   Archie Endo, MD 08/15/2022, 1:10 AM

## 2022-08-16 LAB — CBC
HCT: 29.8 % — ABNORMAL LOW (ref 36.0–46.0)
Hemoglobin: 10.1 g/dL — ABNORMAL LOW (ref 12.0–15.0)
MCH: 29.7 pg (ref 26.0–34.0)
MCHC: 33.9 g/dL (ref 30.0–36.0)
MCV: 87.6 fL (ref 80.0–100.0)
Platelets: 148 10*3/uL — ABNORMAL LOW (ref 150–400)
RBC: 3.4 MIL/uL — ABNORMAL LOW (ref 3.87–5.11)
RDW: 12.9 % (ref 11.5–15.5)
WBC: 6.2 10*3/uL (ref 4.0–10.5)
nRBC: 0 % (ref 0.0–0.2)

## 2022-08-16 LAB — CREATININE, SERUM
Creatinine, Ser: 0.58 mg/dL (ref 0.44–1.00)
GFR, Estimated: >60 mL/min (ref >60)

## 2022-08-16 LAB — SURGICAL PATHOLOGY

## 2022-08-16 NOTE — Lactation Note (Signed)
This note was copied from a baby's chart. Lactation Consultation Note  Patient Name: Colleen Castillo [id number] Date: 08/16/2022 Age:23 hours  Reason for consult: Follow-up assessment;Primapara;1st time breastfeeding  P1, GA [redacted]w[redacted]d, 3% weight loss  Mother reports baby is latching well. She is also supplementing with formula by choice. She denies any concerns or questions. Encouraged to latch baby with each feeding to stimulate milk production and provide infant colostrum. Breastfeed 8-12 times in 24 hours, call for help as needed, and inform nurses when baby is breastfeeding.    Maternal Data Has patient been taught Hand Expression?: Yes Does the patient have breastfeeding experience prior to this delivery?: No  Feeding Mother's Current Feeding Choice: Breast Milk Nipple Type: Slow - flow      Consult Status Consult Status: Follow-up Date: 08/17/22 Follow-up type: In-patient    Stana Bunting M 08/16/2022, 7:04 PM

## 2022-08-16 NOTE — Progress Notes (Signed)
Subjective: Postpartum Day 1: Cesarean Delivery Patient reports tolerating PO, + flatus, + BM and no problems voiding.    Objective: Vital signs in last 24 hours: Temp:  [98.3 F (36.8 C)] 98.3 F (36.8 C) (04/04 0636) Pulse Rate:  [82-88] 88 (04/04 0636) Resp:  [16-17] 16 (04/04 0636) BP: (109-119)/(67-74) 109/74 (04/04 0636) SpO2:  [98 %-100 %] 100 % (04/04 0636)  Physical Exam:  General: alert Lochia: appropriate Uterine Fundus: firm Incision: healing well, no significant drainage DVT Evaluation: No evidence of DVT seen on physical exam.  Recent Labs    08/14/22 1939 08/16/22 0422  HGB 12.8 10.1*  HCT 39.0 29.8*    Assessment/Plan: Status post Cesarean section. Doing well postoperatively.  Continue current care.  Frederich Montilla A Marquest Gunkel 08/16/2022, 12:25 PM

## 2022-08-16 NOTE — Plan of Care (Signed)

## 2022-08-17 MED ORDER — IBUPROFEN 600 MG PO TABS: 600.0000 mg | ORAL_TABLET | Freq: Four times a day (QID) | ORAL | 1 refills | Status: AC | PRN

## 2022-08-17 MED ORDER — OXYCODONE HCL 5 MG PO TABS: 5.0000 mg | ORAL_TABLET | Freq: Four times a day (QID) | ORAL | 0 refills | Status: AC | PRN

## 2022-08-17 NOTE — Discharge Summary (Signed)
Postpartum Discharge Summary  Date of Service updated***     Patient Name: Colleen Castillo DOB: [birth year] MRN: [id number]  Date of admission: 08/14/2022 Delivery date:08/15/2022  Delivering provider: Hoover Browns  Date of discharge: 08/17/2022  Admitting diagnosis: Encounter for induction of labor [Z34.90] Intrauterine pregnancy: [redacted]w[redacted]d     Secondary diagnosis:  Principal Problem:   Encounter for induction of labor  Additional problems: ***    Discharge diagnosis: {DX.:23714}                                              Post partum procedures:{Postpartum procedures:23558} Augmentation: {Augmentation:20782} Complications: {OB Labor/Delivery Complications:20784}  Hospital course: {Courses:23701}  Magnesium Sulfate received: {Mag received:30440022} BMZ received: {BMZ received:30440023} Rhophylac:{Rhophylac received:30440032} MMR:{MMR:30440033} T-DaP:{Tdap:23962} Flu: {NOB:09628} Transfusion:{Transfusion received:30440034}  Physical exam  Vitals:   08/16/22 0636 08/16/22 1241 08/16/22 2139 08/17/22 0602  BP: 109/74 121/81 116/75 102/61  Pulse: 88 90 85 80  Resp: 16 20 18 16   Temp: 98.3 F (36.8 C) 98.1 F (36.7 C) 99 F (37.2 C) 98.1 F (36.7 C)  TempSrc: Oral Oral Oral Oral  SpO2: 100% 97%  98%  Weight:      Height:       General: {Exam; general:21111117} Lochia: {Desc; appropriate/inappropriate:30686::"appropriate"} Uterine Fundus: {Desc; firm/soft:30687} Incision: {Exam; incision:21111123} DVT Evaluation: {Exam; dvt:2111122} Labs: Lab Results  Component Value Date   WBC 6.2 08/16/2022   HGB 10.1 (L) 08/16/2022   HCT 29.8 (L) 08/16/2022   MCV 87.6 08/16/2022   PLT 148 (L) 08/16/2022      Latest Ref Rng & Units 08/16/2022    4:22 AM  CMP  Creatinine 0.44 - 1.00 mg/dL 3.66    Edinburgh Score:    08/16/2022    9:00 AM  Edinburgh Postnatal Depression Scale Screening Tool  I have been able to laugh and see the funny side of things. 0  I have looked forward  with enjoyment to things. 0  I have blamed myself unnecessarily when things went wrong. 2  I have been anxious or worried for no good reason. 2  I have felt scared or panicky for no good reason. 2  Things have been getting on top of me. 0  I have been so unhappy that I have had difficulty sleeping. 1  I have felt sad or miserable. 1  I have been so unhappy that I have been crying. 0  The thought of harming myself has occurred to me. 0  Edinburgh Postnatal Depression Scale Total 8      After visit meds:  Allergies as of 08/17/2022   No Known Allergies      Medication List     TAKE these medications    cholecalciferol 25 MCG (1000 UNIT) tablet Commonly known as: VITAMIN D3 Take 1,000 Units by mouth daily.   ibuprofen 600 MG tablet Commonly known as: ADVIL Take 1 tablet (600 mg total) by mouth every 6 (six) hours as needed for mild pain, moderate pain or cramping.   oxyCODONE 5 MG immediate release tablet Commonly known as: Oxy IR/ROXICODONE Take 1-2 tablets (5-10 mg total) by mouth every 6 (six) hours as needed for moderate pain, severe pain or breakthrough pain.   prenatal multivitamin Tabs tablet Take 1 tablet by mouth daily at 12 noon.         Discharge home in stable condition Infant Feeding: {  Baby feeding:23562} Infant Disposition:{CHL IP OB HOME WITH VHQION:62952}OTHER:23581} Discharge instruction: per After Visit Summary and Postpartum booklet. Activity: Advance as tolerated. Pelvic rest for 6 weeks.  Diet: {OB diet:21111121} Anticipated Birth Control: {Birth Control:23956} Postpartum Appointment:{Outpatient follow up:23559} Additional Postpartum F/U: {PP Procedure:23957} Future Appointments:No future appointments. Follow up Visit:  Follow-up Information     [facility]. Schedule an appointment as soon as possible for a visit in 1 week(s).   Specialty: Obstetrics and Gynecology Why: For Postpartum follow-up Contact information: [address] [place] [phone]        [facility]. Schedule an appointment as soon as possible for a visit in 6 week(s).   Specialty: Obstetrics and Gynecology Contact information: [address] [place] [phone]                    08/17/2022 Purcell NailsAngela Y Gevin Perea, MD

## 2022-08-17 NOTE — Lactation Note (Signed)
This note was copied from a baby's chart. Lactation Consultation Note  Patient Name: Colleen Castillo Date: 08/17/2022 Age:23 hours  Per RN ( [name]) Birth Parent would like to be seen by [name] services in morning but not tonight.    Maternal Data    Feeding Nipple Type: Slow - flow  LATCH Score                    Lactation Tools Discussed/Used    Interventions    Discharge    Consult Status      [name] 08/17/2022, 12:43 AM

## 2022-08-17 NOTE — Lactation Note (Signed)
This note was copied from a baby's chart. Lactation Consultation Note  Patient Name: Colleen Castillo [id number] Date: 08/17/2022 Age:23 hours Reason for consult: Follow-up assessment;Primapara;1st time breastfeeding;Term  LC in to room, parents are expecting discharge. LC assisted with latch. Reviewed deep latch, positioning, alignment. Observed deep tugs and parent verbalizes comfort with latch. Encouraged pumping for stimulation and supplementation. Discussed pace bottle-feeding and volume guidelines for formula feeding. Colostrum is easily expressed and collected ~29mL demonstrating manual pump with 21-mm flange.  Discussed normal behavior and patterns after 24h, voids and stools as signs good intake, pumping, clusterfeeding, skin to skin. Talked about milk coming into volume and managing engorgement.  Submitted request for [facility] pump.  Plan: 1-Feeding on demand or 8-12 times in 24h period. 2-Hand express/pump as needed for supplementation 3-Encouraged birthing parent rest, hydration and food intake.   Contact LC as needed for feeds/support/concerns/questions. All questions answered at this time. Reviewed local resources for support..   Maternal Data Has patient been taught Hand Expression?: Yes Does the patient have breastfeeding experience prior to this delivery?: No  Feeding Mother's Current Feeding Choice: Breast Milk and Formula Nipple Type: Slow - flow  LATCH Score Latch: Grasps breast easily, tongue down, lips flanged, rhythmical sucking.  Audible Swallowing: Spontaneous and intermittent  Type of Nipple: Everted at rest and after stimulation (short shafted)  Comfort (Breast/Nipple): Soft / non-tender  Hold (Positioning): Assistance needed to correctly position infant at breast and maintain latch.  LATCH Score: 9   Lactation Tools Discussed/Used Tools: Pump;Flanges;Coconut oil Flange Size: 21 Breast pump type: Manual Pump Education: Milk Storage;Setup, frequency, and  cleaning Reason for Pumping: stimulation and supplementation Pumping frequency: as needed Pumped volume: 4 mL  Interventions Interventions: Breast feeding basics reviewed;Assisted with latch;Skin to skin;Breast massage;Hand express;Breast compression;Hand pump;Expressed milk;Coconut oil;Adjust position;Support pillows;Education;Pace feeding  Discharge Discharge Education: Engorgement and breast care;Warning signs for feeding baby Pump: Manual;Personal (submited request for stork pump) WIC Program: Yes  Consult Status Consult Status: Complete Date: 08/17/22 Follow-up type: Call as needed    Demetrion Wesby A Higuera Ancidey 08/17/2022, 11:18 AM

## 2022-08-24 ENCOUNTER — Inpatient Hospital Stay (HOSPITAL_COMMUNITY): Payer: Medicaid Other

## 2022-08-24 ENCOUNTER — Inpatient Hospital Stay (HOSPITAL_COMMUNITY)
Admission: RE | Admit: 2022-08-24 | Payer: Medicaid Other | Source: Home / Self Care | Admitting: Obstetrics and Gynecology

## 2022-08-25 ENCOUNTER — Telehealth (HOSPITAL_COMMUNITY): Payer: Self-pay

## 2022-08-25 NOTE — Telephone Encounter (Signed)
Patient reports feeling good. Patient declines questions/concerns about her health and healing.  Patient reports that baby is doing well. Eating, peeing/pooping, and gaining weight well. Baby sleeps in a bassinet. RN reviewed ABC's of safe sleep with patient. Patient declines any questions or concerns about baby.  EPDS score is 5.  Suann Larry [facility]  Perinatal Services   08/25/22,1839

## 2022-08-25 NOTE — Telephone Encounter (Signed)
Patient is sleeping. RN will call patient back at a later time.   Signe Colt  08/25/22,1019

## 2022-11-26 ENCOUNTER — Ambulatory Visit: Payer: Medicaid Other | Attending: Obstetrics and Gynecology | Admitting: Physical Therapy

## 2022-11-26 NOTE — Therapy (Deleted)
OUTPATIENT PHYSICAL THERAPY FEMALE PELVIC EVALUATION   Patient Name: Colleen Castillo MRN: [id number] DOB:2000/10/05, 22 y.o., female Today's Date: 11/26/2022  END OF SESSION:   Past Medical History:  Diagnosis Date   Asthma    Past Surgical History:  Procedure Laterality Date   CESAREAN SECTION N/A 08/15/2022   Procedure: CESAREAN SECTION;  Surgeon: Hoover Browns, MD;  Location: [facility] LD ORS;  Service: Obstetrics;  Laterality: N/A;   NO PAST SURGERIES     Patient Active Problem List   Diagnosis Date Noted   Encounter for induction of labor 08/14/2022   Asthma     PCP: ***  REFERRING PROVIDER:   Hands, Kylie, NP    REFERRING DIAG:  Z39.2 (ICD-10-CM) - Encounter for routine postpartum follow-up  N81.89 (ICD-10-CM) - Pelvic floor weakness  K59.00 (ICD-10-CM) - Constipation    THERAPY DIAG:  No diagnosis found.  Rationale for Evaluation and Treatment: Rehabilitation  ONSET DATE: c-section 08/15/22  SUBJECTIVE:                                                                                                                                                                                           SUBJECTIVE STATEMENT: *** Fluid intake: {Yes/No:304960894}   PAIN:  Are you having pain? {yes/no:20286} NPRS scale: ***/10 Pain location: {pelvic pain location:27098}  Pain type: {type:313116} Pain description: {PAIN DESCRIPTION:21022940}   Aggravating factors: *** Relieving factors: ***  PRECAUTIONS: {Therapy precautions:24002}  RED FLAGS: {PT Red Flags:29287}   WEIGHT BEARING RESTRICTIONS: {Yes ***/No:24003}  FALLS:  Has patient fallen in last 6 months? {fallsyesno:27318}  LIVING ENVIRONMENT: Lives with: {OPRC lives with:25569::"lives with their family"} Lives in: {Lives in:25570} Stairs: {opstairs:27293} Has following equipment at home: {Assistive devices:23999}  OCCUPATION: ***  PLOF: {PLOF:24004}  PATIENT GOALS: ***  PERTINENT HISTORY:  *** Sexual abuse:  {Yes/No:304960894}  BOWEL MOVEMENT: Pain with bowel movement: {yes/no:20286} Type of bowel movement:{PT BM type:27100} Fully empty rectum: {Yes/No:304960894} Leakage: {Yes/No:304960894} Pads: {Yes/No:304960894} Fiber supplement: {Yes/No:304960894}  URINATION: Pain with urination: {yes/no:20286} Fully empty bladder: {Yes/No:304960894} Stream: {PT urination:27102} Urgency: {Yes/No:304960894} Frequency: *** Leakage: {PT leakage:27103} Pads: {Yes/No:304960894}  INTERCOURSE: Pain with intercourse: {pain with intercourse PA:27099} Ability to have vaginal penetration:  {Yes/No:304960894} Climax: *** Marinoff Scale: ***/3  PREGNANCY: Vaginal deliveries *** Tearing {Yes***/No:304960894} C-section deliveries *** Currently pregnant {Yes***/No:304960894}  PROLAPSE: {PT prolapse:27101}   OBJECTIVE:   DIAGNOSTIC FINDINGS:  ***  PATIENT SURVEYS:  {rehab surveys:24030}  PFIQ-7 ***  COGNITION: Overall cognitive status: {cognition:24006}     SENSATION: Light touch: {intact/deficits:24005} Proprioception: {intact/deficits:24005}  MUSCLE LENGTH: Hamstrings: Right *** deg; Left *** deg Thomas test: Right *** deg; Left *** deg  LUMBAR  SPECIAL TESTS:  {lumbar special test:25242}  FUNCTIONAL TESTS:  {Functional tests:24029}  GAIT: Distance walked: *** Assistive device utilized: {Assistive devices:23999} Level of assistance: {Levels of assistance:24026} Comments: ***  POSTURE: {posture:25561}  PELVIC ALIGNMENT:  LUMBARAROM/PROM:  A/PROM A/PROM  eval  Flexion   Extension   Right lateral flexion   Left lateral flexion   Right rotation   Left rotation    (Blank rows = not tested)  LOWER EXTREMITY ROM:  {AROM/PROM:27142} ROM Right eval Left eval  Hip flexion    Hip extension    Hip abduction    Hip adduction    Hip internal rotation    Hip external rotation    Knee flexion    Knee extension    Ankle dorsiflexion    Ankle plantarflexion    Ankle  inversion    Ankle eversion     (Blank rows = not tested)  LOWER EXTREMITY MMT:  MMT Right eval Left eval  Hip flexion    Hip extension    Hip abduction    Hip adduction    Hip internal rotation    Hip external rotation    Knee flexion    Knee extension    Ankle dorsiflexion    Ankle plantarflexion    Ankle inversion    Ankle eversion     PALPATION:   General  ***                External Perineal Exam ***                             Internal Pelvic Floor ***  Patient confirms identification and approves PT to assess internal pelvic floor and treatment {yes/no:20286}  PELVIC MMT:   MMT eval  Vaginal   Internal Anal Sphincter   External Anal Sphincter   Puborectalis   Diastasis Recti   (Blank rows = not tested)        TONE: ***  PROLAPSE: ***  TODAY'S TREATMENT:                                                                                                                              DATE: ***  EVAL ***   PATIENT EDUCATION:  Education details: *** Person educated: {Person educated:25204} Education method: {Education Method:25205} Education comprehension: {Education Comprehension:25206}  HOME EXERCISE PROGRAM: ***  ASSESSMENT:  CLINICAL IMPRESSION: Patient is a *** y.o. *** who was seen today for physical therapy evaluation and treatment for ***.   OBJECTIVE IMPAIRMENTS: {opptimpairments:25111}.   ACTIVITY LIMITATIONS: {activitylimitations:27494}  PARTICIPATION LIMITATIONS: {participationrestrictions:25113}  PERSONAL FACTORS: {Personal factors:25162} are also affecting patient's functional outcome.   REHAB POTENTIAL: {rehabpotential:25112}  CLINICAL DECISION MAKING: {clinical decision making:25114}  EVALUATION COMPLEXITY: {Evaluation complexity:25115}   GOALS: Goals reviewed with patient? {yes/no:20286}  SHORT TERM GOALS: Target date: ***  *** Baseline: Goal status: INITIAL  2.  *** Baseline:  Goal status: INITIAL  3.   *** Baseline:  Goal status: INITIAL  4.  *** Baseline:  Goal status: INITIAL  5.  *** Baseline:  Goal status: INITIAL  6.  *** Baseline:  Goal status: INITIAL  LONG TERM GOALS: Target date: ***  *** Baseline:  Goal status: INITIAL  2.  *** Baseline:  Goal status: INITIAL  3.  *** Baseline:  Goal status: INITIAL  4.  *** Baseline:  Goal status: INITIAL  5.  *** Baseline:  Goal status: INITIAL  6.  *** Baseline:  Goal status: INITIAL  PLAN:  PT FREQUENCY: {rehab frequency:25116}  PT DURATION: {rehab duration:25117}  PLANNED INTERVENTIONS: {rehab planned interventions:25118::"Therapeutic exercises","Therapeutic activity","Neuromuscular re-education","Balance training","Gait training","Patient/Family education","Self Care","Joint mobilization"}  PLAN FOR NEXT SESSION: ***   Brayton Caves Zackerie Sara, PT 11/26/2022, 10:51 AM

## 2023-05-01 ENCOUNTER — Other Ambulatory Visit: Payer: Self-pay

## 2023-05-01 MED ORDER — LISDEXAMFETAMINE DIMESYLATE 30 MG PO CAPS: 30.0000 mg | ORAL_CAPSULE | Freq: Every day | ORAL | 0 refills | Status: AC

## 2023-05-01 MED ORDER — WEGOVY 0.5 MG/0.5ML ~~LOC~~ SOAJ: 0.5000 mg | SUBCUTANEOUS | 0 refills | Status: AC

## 2023-06-20 ENCOUNTER — Other Ambulatory Visit: Payer: Self-pay

## 2023-06-20 MED ORDER — LISDEXAMFETAMINE DIMESYLATE 30 MG PO CAPS
30.0000 mg | ORAL_CAPSULE | Freq: Every day | ORAL | 0 refills | Status: AC
  Filled 2023-06-20: qty 30, 30d supply, fill #0

## 2023-06-20 MED ORDER — WEGOVY 0.5 MG/0.5ML ~~LOC~~ SOAJ
0.5000 mL | SUBCUTANEOUS | 0 refills | Status: AC
  Filled 2023-06-20: qty 2, 28d supply, fill #0

## 2023-06-21 ENCOUNTER — Other Ambulatory Visit: Payer: Self-pay

## 2023-06-24 ENCOUNTER — Other Ambulatory Visit: Payer: Self-pay

## 2023-06-26 ENCOUNTER — Other Ambulatory Visit: Payer: Self-pay

## 2023-07-16 ENCOUNTER — Other Ambulatory Visit: Payer: Self-pay

## 2023-07-16 MED ORDER — ERGOCALCIFEROL 1.25 MG (50000 UT) PO CAPS
1.0000 | ORAL_CAPSULE | ORAL | 5 refills | Status: AC
  Filled 2023-07-16: qty 4, 28d supply, fill #0

## 2023-07-16 MED ORDER — WEGOVY 0.5 MG/0.5ML ~~LOC~~ SOAJ
0.5000 mg | SUBCUTANEOUS | 0 refills | Status: AC
  Filled 2023-07-16: qty 2, 28d supply, fill #0

## 2023-07-22 ENCOUNTER — Other Ambulatory Visit: Payer: Self-pay

## 2023-08-09 ENCOUNTER — Encounter: Payer: Self-pay | Admitting: Emergency Medicine

## 2023-08-09 ENCOUNTER — Ambulatory Visit
Admission: EM | Admit: 2023-08-09 | Discharge: 2023-08-09 | Disposition: A | Discharge status: Home / Self Care | Attending: Internal Medicine | Admitting: Internal Medicine

## 2023-08-09 DIAGNOSIS — H6122 Impacted cerumen, left ear: Secondary | ICD-10-CM | POA: Diagnosis not present

## 2023-08-09 NOTE — Discharge Instructions (Signed)
We flushed out the earwax from your ear(s) today.  Do not use Q-tips as this makes symptoms worse and pushes wax further into the ear canal.  You may use over-the-counter Debrox eardrops as needed for earwax removal at home as needed.  If you develop any new or worsening symptoms or if your symptoms do not start to improve, pleases return here or follow-up with your primary care provider. If your symptoms are severe, please go to the emergency room.

## 2023-08-09 NOTE — ED Triage Notes (Signed)
 Pt c/o left ear fullness and not being able to hear. States she cleaned ears a few days ago and after that she could not hear.

## 2023-08-09 NOTE — ED Provider Notes (Signed)
 [name]    CSN: [id number] Arrival date & time: 08/09/23  1111      History   Chief Complaint Chief Complaint  Patient presents with   Ear Fullness    HPI Colleen Castillo is a 23 y.o. female.   Patient presents to urgent care for evaluation of left-sided ear fullness sensation that started a few days ago.  Reports decreased hearing to the left ear.  Denies nausea, vomiting, cough, congestion, dizziness, tinnitus, and drainage from the ears.  She attempted to use Q-tips to remove wax from left ear which she believes pushed the wax deeper into the ear canal.  No recent fevers.  Right ear is asymptomatic.   Ear Fullness    Past Medical History:  Diagnosis Date   Asthma     Patient Active Problem List   Diagnosis Date Noted   Encounter for induction of labor 08/14/2022   Asthma     Past Surgical History:  Procedure Laterality Date   CESAREAN SECTION N/A 08/15/2022   Procedure: CESAREAN SECTION;  Surgeon: Hoover Browns, MD;  Location: [facility] LD [facility]S;  Service: Obstetrics;  Laterality: N/A;   NO PAST SURGERIES      OB History     Gravida  1   Para  1   Term  1   Preterm  0   AB  0   Living  1      SAB  0   IAB  0   Ectopic  0   Multiple  0   Live Births  1            Home Medications    Prior to Admission medications   Medication Sig Start Date End Date Taking? Authorizing Provider  cholecalciferol (VITAMIN D3) 25 MCG (1000 UNIT) tablet Take 1,000 Units by mouth daily.    [provider]  ergocalciferol (VITAMIN D2) 1.25 MG (50000 UT) capsule Take 1 capsule (50,000 Units total) by mouth once a week. 07/16/23   Felix Pacini, FNP  ibuprofen (ADVIL) 600 MG tablet Take 1 tablet (600 mg total) by mouth every 6 (six) hours as needed for mild pain, moderate pain or cramping. 08/17/22   Osborn Coho, MD  lisdexamfetamine (VYVANSE) 30 MG capsule Take 1 capsule (30 mg total) by mouth daily. 05/01/23     lisdexamfetamine (VYVANSE)  30 MG capsule Take 1 capsule (30 mg total) by mouth daily. 06/20/23     oxyCODONE (OXY IR/ROXICODONE) 5 MG immediate release tablet Take 1-2 tablets (5-10 mg total) by mouth every 6 (six) hours as needed for moderate pain, severe pain or breakthrough pain. 08/17/22   Osborn Coho, MD  Prenatal Vit-Fe Fumarate-FA (PRENATAL MULTIVITAMIN) TABS tablet Take 1 tablet by mouth daily at 12 noon.    [provider]  Semaglutide-Weight Management (WEGOVY) 0.5 MG/0.5ML SOAJ Inject 0.5 mg into the skin once a week. 05/01/23     Semaglutide-Weight Management (WEGOVY) 0.5 MG/0.5ML SOAJ Inject 0.5 mg into the skin once a week. 06/20/23     Semaglutide-Weight Management (WEGOVY) 0.5 MG/0.5ML SOAJ Inject 0.5 mg into the skin once a week. 07/16/23       Family History Family History  Problem Relation Age of Onset   Thyroid disease Mother    Healthy Father    Breast cancer Paternal Grandmother     Social History Social History   Tobacco Use   Smoking status: Never   Smokeless tobacco: Never  Vaping Use   Vaping status:  Never Used  Substance Use Topics   Alcohol use: Never   Drug use: Never     Allergies   Patient has no known allergies.   Review of Systems Review of Systems Per HPI  Physical Exam Triage Vital Signs ED Triage Vitals  Encounter Vitals Group     BP 08/09/23 1153 121/74     Systolic BP Percentile --      Diastolic BP Percentile --      Pulse Rate 08/09/23 1153 82     Resp 08/09/23 1153 20     Temp 08/09/23 1153 98.4 F (36.9 C)     Temp Source 08/09/23 1153 Oral     SpO2 08/09/23 1153 96 %     Weight --      Height --      Head Circumference --      Peak Flow --      Pain Score 08/09/23 1158 3     Pain Loc --      Pain Education --      Exclude from Growth Chart --    No data found.  Updated Vital Signs BP 121/74 (BP Location: Right Arm)   Pulse 82   Temp 98.4 F (36.9 C) (Oral)   Resp 20   LMP 07/25/2023 (Approximate)   SpO2 96%   Breastfeeding No    Visual Acuity Right Eye Distance:   Left Eye Distance:   Bilateral Distance:    Right Eye Near:   Left Eye Near:    Bilateral Near:     Physical Exam Vitals and nursing note reviewed.  Constitutional:      Appearance: She is not ill-appearing or toxic-appearing.  HENT:     Head: Normocephalic and atraumatic.     Right Ear: Hearing, tympanic membrane, ear canal and external ear normal.     Left Ear: Hearing, tympanic membrane, ear canal and external ear normal. There is impacted cerumen.     Nose: Nose normal.     Mouth/Throat:     Lips: Pink.  Eyes:     General: Lids are normal. Vision grossly intact. Gaze aligned appropriately.     Extraocular Movements: Extraocular movements intact.     Conjunctiva/sclera: Conjunctivae normal.  Pulmonary:     Effort: Pulmonary effort is normal.  Musculoskeletal:     Cervical back: Neck supple.  Skin:    General: Skin is warm and dry.     Capillary Refill: Capillary refill takes less than 2 seconds.     Findings: No rash.  Neurological:     General: No focal deficit present.     Mental Status: She is alert and oriented to person, place, and time. Mental status is at baseline.     Cranial Nerves: No dysarthria or facial asymmetry.  Psychiatric:        Mood and Affect: Mood normal.        Speech: Speech normal.        Behavior: Behavior normal.        Thought Content: Thought content normal.        Judgment: Judgment normal.      UC Treatments / Results  Labs (all labs ordered are listed, but only abnormal results are displayed) Labs Reviewed - No data to display  EKG   Radiology No results found.  Procedures Procedures (including critical care time)  Medications Ordered in UC Medications - No data to display  Initial Impression / Assessment and Plan / UC Course  I have reviewed the triage vital signs and the nursing notes.  Pertinent labs & imaging results that were available during my care of the patient were  reviewed by me and considered in my medical decision making (see chart for details).   1.  Left cerumen impaction Left ear(s) cleaned with ear lavage to remove ear wax impactions bilaterally by nursing staff.  Reassessment shows normal left TM.  Patient may use debrox ear drops at home as needed for wax removal and has been advised to avoid using Q-tips.   Counseled patient on potential for adverse effects with medications prescribed/recommended today, strict ER and return-to-clinic precautions discussed, patient verbalized understanding.    Final Clinical Impressions(s) / UC Diagnoses   Final diagnoses:  Impacted cerumen of left ear     Discharge Instructions      We flushed out the earwax from your ear(s) today.  Do not use Q-tips as this makes symptoms worse and pushes wax further into the ear canal.  You may use over-the-counter Debrox eardrops as needed for earwax removal at home as needed.  If you develop any new or worsening symptoms or if your symptoms do not start to improve, pleases return here or follow-up with your primary care provider. If your symptoms are severe, please go to the emergency room.      ED Prescriptions   None    PDMP not reviewed this encounter.   Carlisle Beers, [place] 08/09/23 1250

## 2023-08-21 ENCOUNTER — Other Ambulatory Visit: Payer: Self-pay

## 2023-08-21 MED ORDER — WEGOVY 1 MG/0.5ML ~~LOC~~ SOAJ
1.0000 mg | SUBCUTANEOUS | 0 refills | Status: AC
  Filled 2023-08-21: qty 2, 28d supply, fill #0

## 2024-03-24 ENCOUNTER — Ambulatory Visit
Admission: RE | Admit: 2024-03-24 | Discharge: 2024-03-24 | Disposition: A | Discharge status: Home / Self Care | Source: Ambulatory Visit | Attending: Physician Assistant | Admitting: Physician Assistant

## 2024-03-24 VITALS — BP 113/78 | HR 79 | Temp 98.2°F | Resp 19 | Ht 68.0 in | Wt 278.0 lb

## 2024-03-24 DIAGNOSIS — N949 Unspecified condition associated with female genital organs and menstrual cycle: Secondary | ICD-10-CM | POA: Diagnosis present

## 2024-03-24 DIAGNOSIS — Z113 Encounter for screening for infections with a predominantly sexual mode of transmission: Secondary | ICD-10-CM | POA: Diagnosis not present

## 2024-03-24 LAB — POCT URINE DIPSTICK
Bilirubin, UA: NEGATIVE
Blood, UA: NEGATIVE
Glucose, UA: NEGATIVE mg/dL
Ketones, POC UA: NEGATIVE mg/dL
Leukocytes, UA: NEGATIVE
Nitrite, UA: NEGATIVE
Spec Grav, UA: 1.025 (ref 1.010–1.025)
Urobilinogen, UA: 0.2 U/dL
pH, UA: 7 (ref 5.0–8.0)

## 2024-03-24 LAB — POCT URINE PREGNANCY: Preg Test, Ur: NEGATIVE

## 2024-03-24 NOTE — ED Provider Notes (Signed)
 [facility]    CSN: [id number] Arrival date & time: 03/24/24  1709      History   Chief Complaint Chief Complaint  Patient presents with   Vaginal Discomfort     HPI Colleen Castillo is a 23 y.o. female.  has a past medical history of Asthma.   HPI  Discussed the use of AI scribe software for clinical note transcription with the patient, who gave verbal consent to proceed. The patient presents for a routine STD screening with mild discomfort.  She experiences slight discomfort in the genital area, which began on the second day after her last sexual encounter. No discharge, unusual odor, pain during urination, changes in vaginal discharge or bleeding, fever, chills, sores, or rashes in the genital area.  Occasional abdominal pain is noted, attributed to a previous C-section, with cramping that comes and goes.  She is sexually active but distrusts her partner, motivating her to seek STD screening. Her partner has not expressed concerns about STD exposure or symptoms.    Past Medical History:  Diagnosis Date   Asthma     Patient Active Problem List   Diagnosis Date Noted   Encounter for induction of labor 08/14/2022   Asthma     Past Surgical History:  Procedure Laterality Date   CESAREAN SECTION N/A 08/15/2022   Procedure: CESAREAN SECTION;  Surgeon: Gloriann Chick, MD;  Location: [facility];  Service: Obstetrics;  Laterality: N/A;   NO PAST SURGERIES      OB History     Gravida  1   Para  1   Term  1   Preterm  0   AB  0   Living  1      SAB  0   IAB  0   Ectopic  0   Multiple  0   Live Births  1            Home Medications    Prior to Admission medications   Medication Sig Start Date End Date Taking? Authorizing Provider  cholecalciferol (VITAMIN D3) 25 MCG (1000 UNIT) tablet Take 1,000 Units by mouth daily.    [provider]  ergocalciferol  (VITAMIN D2) 1.25 MG (50000 UT) capsule Take 1 capsule (50,000 Units total)  by mouth once a week. 07/16/23   Elizbeth Leita Ruth, FNP  ibuprofen  (ADVIL ) 600 MG tablet Take 1 tablet (600 mg total) by mouth every 6 (six) hours as needed for mild pain, moderate pain or cramping. 08/17/22   Henry Slough, MD  lisdexamfetamine (VYVANSE ) 30 MG capsule Take 1 capsule (30 mg total) by mouth daily. 05/01/23     lisdexamfetamine (VYVANSE ) 30 MG capsule Take 1 capsule (30 mg total) by mouth daily. 06/20/23     oxyCODONE  (OXY IR/ROXICODONE ) 5 MG immediate release tablet Take 1-2 tablets (5-10 mg total) by mouth every 6 (six) hours as needed for moderate pain, severe pain or breakthrough pain. 08/17/22   Henry Slough, MD  Prenatal Vit-Fe Fumarate-FA (PRENATAL MULTIVITAMIN) TABS tablet Take 1 tablet by mouth daily at 12 noon.    [provider]  Semaglutide -Weight Management (WEGOVY ) 0.5 MG/0.5ML SOAJ Inject 0.5 mg into the skin once a week. 05/01/23     Semaglutide -Weight Management (WEGOVY ) 0.5 MG/0.5ML SOAJ Inject 0.5 mg into the skin once a week. 06/20/23     Semaglutide -Weight Management (WEGOVY ) 0.5 MG/0.5ML SOAJ Inject 0.5 mg into the skin once a week. 07/16/23     Semaglutide -Weight Management (WEGOVY ) 1 MG/0.5ML SOAJ  Inject 1 mg into the skin once a week. 08/21/23       Family History Family History  Problem Relation Age of Onset   Thyroid disease Mother    Healthy Father    Breast cancer Paternal Grandmother     Social History Social History   Tobacco Use   Smoking status: Never   Smokeless tobacco: Never  Vaping Use   Vaping status: Never Used  Substance Use Topics   Alcohol use: Never   Drug use: Never     Allergies   Patient has no known allergies.   Review of Systems Review of Systems  Constitutional:  Negative for chills and fever.  Gastrointestinal:  Positive for abdominal pain (suprapubic pain, intermittent).  Genitourinary:  Negative for dysuria, genital sores, vaginal bleeding, vaginal discharge and vaginal pain.  Skin:  Negative for rash.      Physical Exam Triage Vital Signs ED Triage Vitals  Encounter Vitals Group     BP 03/24/24 1755 113/78     Girls Systolic BP Percentile --      Girls Diastolic BP Percentile --      Boys Systolic BP Percentile --      Boys Diastolic BP Percentile --      Pulse Rate 03/24/24 1755 79     Resp 03/24/24 1755 19     Temp 03/24/24 1755 98.2 F (36.8 C)     Temp Source 03/24/24 1755 Oral     SpO2 03/24/24 1755 96 %     Weight 03/24/24 1758 278 lb (126.1 kg)     Height 03/24/24 1758 5' 8 (1.727 m)     Head Circumference --      Peak Flow --      Pain Score 03/24/24 1757 0     Pain Loc --      Pain Education --      Exclude from Growth Chart --    No data found.  Updated Vital Signs BP 113/78 (BP Location: Right Arm)   Pulse 79   Temp 98.2 F (36.8 C) (Oral)   Resp 19   Ht 5' 8 (1.727 m)   Wt 278 lb (126.1 kg)   LMP 02/24/2024 (Exact Date)   SpO2 96%   Breastfeeding No   BMI 42.27 kg/m   Visual Acuity Right Eye Distance:   Left Eye Distance:   Bilateral Distance:    Right Eye Near:   Left Eye Near:    Bilateral Near:     Physical Exam Vitals reviewed.  Constitutional:      General: She is awake.     Appearance: Normal appearance. She is well-developed and well-groomed.  HENT:     Head: Normocephalic and atraumatic.  Eyes:     General: Lids are normal. Gaze aligned appropriately.     Extraocular Movements: Extraocular movements intact.     Conjunctiva/sclera: Conjunctivae normal.  Pulmonary:     Effort: Pulmonary effort is normal.  Neurological:     Mental Status: She is alert and oriented to person, place, and time.  Psychiatric:        Attention and Perception: Attention and perception normal.        Mood and Affect: Mood and affect normal.        Speech: Speech normal.        Behavior: Behavior normal. Behavior is cooperative.        Thought Content: Thought content normal.        Judgment: Judgment  normal.      UC Treatments / Results   Labs (all labs ordered are listed, but only abnormal results are displayed) Labs Reviewed  POCT URINE DIPSTICK - Abnormal; Notable for the following components:      Result Value   Clarity, UA cloudy (*)    Protein Ur, POC trace (*)    All other components within normal limits  POCT URINE PREGNANCY - Normal  RPR  HIV ANTIBODY (ROUTINE TESTING W REFLEX)  CERVICOVAGINAL ANCILLARY ONLY    EKG   Radiology No results found.  Procedures Procedures (including critical care time)  Medications Ordered in UC Medications - No data to display  Initial Impression / Assessment and Plan / UC Course  I have reviewed the triage vital signs and the nursing notes.  Pertinent labs & imaging results that were available during my care of the patient were reviewed by me and considered in my medical decision making (see chart for details).      Final Clinical Impressions(s) / UC Diagnoses   Final diagnoses:  Vaginal discomfort  Screening examination for STD (sexually transmitted disease)   Encounter for STD screening She requested STD screening due to lack of trust in her partner. No symptoms of discharge, odor, dysuria, or fever. Urine pregnancy test negative, no signs of blood or infection in urine. Swab test pending for gonorrhea, chlamydia, bacterial vaginosis, yeast, and trichomonas.  - Await swab test results for gonorrhea, chlamydia, bacterial vaginosis, yeast, and trichomonas. - Will notify her of positive swab results via call and MyChart.  Genital discomfort Reports slight genital discomfort since the second day of the month. No dysuria, changes in vaginal discharge, or bleeding. Occasional abdominal pain attributed to previous C-section.    Discharge Instructions      You were seen today for STD screening.  We collected a cervicovaginal swab that we will assess for gonorrhea, chlamydia, trichomonas, bacterial vaginosis, yeast.  If collected blood work that we will assess for  HIV and syphilis.  We will keep you updated with these results once they are available.  If any medications are indicated by those test results we will call you and medications will either be sent to the pharmacy on file or you can return to the urgent care for an injection.  It is recommended that you refrain from sexual activity until your test results are negative or until you have completed an appropriate medication regimen as dictated by your test results.  Please use a condom or another barrier method to help prevent STD transmission.  Please make sure that you communicate with your partners regarding your test results should any positive results, about as they will also need to be tested and screened.      ED Prescriptions   None    PDMP not reviewed this encounter.   Marylene Rocky FORBES DEVONNA 03/24/24 2012

## 2024-03-24 NOTE — ED Triage Notes (Signed)
 Pt presents with a chief complaint of vaginal discomfort/cramping. Noticed this yesterday. Had unprotected sex with partner recently. Took a plan B on 11/7. Denies discharge/odors. Discomfort is intermittent. No pain. Trying to increase fluids. Has been urinating more but this is expected given fluid intake.

## 2024-03-24 NOTE — Discharge Instructions (Signed)
 You were seen today for STD screening.  We collected a cervicovaginal swab that we will assess for gonorrhea, chlamydia, trichomonas, bacterial vaginosis, yeast.  If collected blood work that we will assess for HIV and syphilis.  We will keep you updated with these results once they are available.  If any medications are indicated by those test results we will call you and medications will either be sent to the pharmacy on file or you can return to the urgent care for an injection.  It is recommended that you refrain from sexual activity until your test results are negative or until you have completed an appropriate medication regimen as dictated by your test results.  Please use a condom or another barrier method to help prevent STD transmission.  Please make sure that you communicate with your partners regarding your test results should any positive results, about as they will also need to be tested and screened.

## 2024-03-25 ENCOUNTER — Ambulatory Visit (HOSPITAL_COMMUNITY): Payer: Self-pay

## 2024-03-25 LAB — CERVICOVAGINAL ANCILLARY ONLY
Bacterial Vaginitis (gardnerella): NEGATIVE
Candida Glabrata: NEGATIVE
Candida Vaginitis: POSITIVE — AB
Chlamydia: NEGATIVE
Comment: NEGATIVE
Comment: NEGATIVE
Comment: NEGATIVE
Comment: NEGATIVE
Comment: NEGATIVE
Comment: NORMAL
Neisseria Gonorrhea: NEGATIVE
Trichomonas: NEGATIVE

## 2024-03-26 LAB — RPR: RPR Ser Ql: NONREACTIVE

## 2024-03-26 LAB — HIV ANTIBODY (ROUTINE TESTING W REFLEX): HIV Screen 4th Generation wRfx: NONREACTIVE

## 2024-03-26 MED ORDER — FLUCONAZOLE 150 MG PO TABS: 150.0000 mg | ORAL_TABLET | Freq: Once | ORAL | 0 refills | Status: AC

## 2024-04-24 ENCOUNTER — Ambulatory Visit
Admission: EM | Admit: 2024-04-24 | Discharge: 2024-04-24 | Disposition: A | Discharge status: Home / Self Care | Attending: Family Medicine | Admitting: Family Medicine

## 2024-04-24 ENCOUNTER — Other Ambulatory Visit: Payer: Self-pay

## 2024-04-24 DIAGNOSIS — M545 Low back pain, unspecified: Secondary | ICD-10-CM

## 2024-04-24 DIAGNOSIS — Z113 Encounter for screening for infections with a predominantly sexual mode of transmission: Secondary | ICD-10-CM

## 2024-04-24 DIAGNOSIS — N898 Other specified noninflammatory disorders of vagina: Secondary | ICD-10-CM

## 2024-04-24 DIAGNOSIS — R300 Dysuria: Secondary | ICD-10-CM | POA: Diagnosis not present

## 2024-04-24 LAB — POCT URINE DIPSTICK
Bilirubin, UA: NEGATIVE
Blood, UA: NEGATIVE
Glucose, UA: NEGATIVE mg/dL
Ketones, POC UA: NEGATIVE mg/dL
Leukocytes, UA: NEGATIVE
Nitrite, UA: NEGATIVE
POC PROTEIN,UA: NEGATIVE
Spec Grav, UA: 1.02 (ref 1.010–1.025)
Urobilinogen, UA: 0.2 U/dL
pH, UA: 7.5 (ref 5.0–8.0)

## 2024-04-24 LAB — POCT URINE PREGNANCY: Preg Test, Ur: NEGATIVE

## 2024-04-24 MED ORDER — KETOROLAC TROMETHAMINE 30 MG/ML IJ SOLN
30.0000 mg | Freq: Once | INTRAMUSCULAR | Status: AC
  Administered 2024-04-24: 30 mg via INTRAMUSCULAR

## 2024-04-24 MED ORDER — METHOCARBAMOL 750 MG PO TABS: 750.0000 mg | ORAL_TABLET | Freq: Two times a day (BID) | ORAL | 0 refills | Status: AC | PRN

## 2024-04-24 NOTE — Discharge Instructions (Addendum)
 You were given a Toradol  injection in clinic today. Do not take any over the counter NSAID's such as Advil , ibuprofen , Aleve , or naproxen  for 24 hours. You may take tylenol  if needed.  You may start Robaxin twice daily as needed.  Please note this is a muscle relaxer will make you drowsy.  Do not drink alcohol or drive on this medication.  Heat to the low back and rest.  Follow-up with your PCP in 2 to 3 days for recheck.  Please go to the ER for any worsening symptoms.  Hope you feel better soon!

## 2024-04-24 NOTE — ED Triage Notes (Signed)
 Pt c/o lower back painx3wks. Pt denies injury. Pt c/o vaginal discomfortx3d. Pt c/o frequent urinationx2d.

## 2024-04-24 NOTE — ED Provider Notes (Addendum)
 [facility]    CSN: [id number] Arrival date & time: 04/24/24  1200      History   Chief Complaint No chief complaint on file.   HPI Colleen Castillo is a 23 y.o. female presents for back pain.  Patient reports 3 weeks of a persistent nonradiating achy/throbbing back pain.  Denies any known injury or inciting event.  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  Denies any numbness/tingling/weakness of her lower extremities, no bowel or bladder incontinence, no saddle paresthesia.  Has had some urinary frequency x 2 days without burning urgency, fevers, nausea or vomiting or flank pain.  Has also had some vaginal discharge that improved with over-the-counter suppositories.  No known STD exposure but would like screening.  No history of back surgeries.  Has not taken any OTC treatments for symptoms since onset.  No other concerns at this time.  HPI  Past Medical History:  Diagnosis Date   Asthma     Patient Active Problem List   Diagnosis Date Noted   Encounter for induction of labor 08/14/2022   Asthma     Past Surgical History:  Procedure Laterality Date   CESAREAN SECTION N/A 08/15/2022   Procedure: CESAREAN SECTION;  Surgeon: Gloriann Chick, MD;  Location: [facility];  Service: Obstetrics;  Laterality: N/A;   NO PAST SURGERIES      OB History     Gravida  1   Para  1   Term  1   Preterm  0   AB  0   Living  1      SAB  0   IAB  0   Ectopic  0   Multiple  0   Live Births  1            Home Medications    Prior to Admission medications  Medication Sig Start Date End Date Taking? Authorizing Provider  methocarbamol (ROBAXIN) 750 MG tablet Take 1 tablet (750 mg total) by mouth 2 (two) times daily as needed for muscle spasms. 04/24/24  Yes Ambrielle Kington, Jodi R, NP  cholecalciferol (VITAMIN D3) 25 MCG (1000 UNIT) tablet Take 1,000 Units by mouth daily.    [provider]  ergocalciferol  (VITAMIN D2) 1.25 MG (50000 UT) capsule Take 1 capsule (50,000 Units total) by mouth once a week. 07/16/23   Elizbeth Leita Ruth, FNP  ibuprofen  (ADVIL ) 600 MG tablet Take 1 tablet (600 mg total) by mouth every 6 (six) hours as needed for mild pain, moderate pain or cramping. 08/17/22   Henry Slough, MD  lisdexamfetamine (VYVANSE ) 30 MG capsule Take 1 capsule (30 mg total) by mouth daily. 05/01/23     lisdexamfetamine (VYVANSE ) 30 MG capsule Take 1 capsule (30 mg total) by mouth daily. 06/20/23     oxyCODONE  (OXY IR/ROXICODONE ) 5 MG immediate release tablet Take 1-2 tablets (5-10 mg total) by mouth every 6 (six) hours as needed for moderate pain, severe pain or breakthrough pain. 08/17/22   Henry Slough, MD  Prenatal Vit-Fe Fumarate-FA (PRENATAL MULTIVITAMIN) TABS tablet Take 1 tablet by mouth daily at 12 noon.    [provider]  Semaglutide -Weight Management (WEGOVY ) 0.5 MG/0.5ML SOAJ Inject 0.5 mg into the skin once a week. 05/01/23     Semaglutide -Weight Management (WEGOVY ) 0.5 MG/0.5ML SOAJ Inject 0.5 mg into the skin once a week. 06/20/23     Semaglutide -Weight Management (WEGOVY ) 0.5  MG/0.5ML SOAJ Inject 0.5 mg into the skin once a week. 07/16/23     Semaglutide -Weight Management (WEGOVY ) 1 MG/0.5ML SOAJ Inject 1 mg into the skin once a week. 08/21/23       Family History Family History  Problem Relation Age of Onset   Thyroid disease Mother    Healthy Father    Breast cancer Paternal Grandmother     Social History Social History[1]   Allergies   Patient has no known allergies.   Review of Systems Review of Systems  Genitourinary:  Positive for frequency and vaginal discharge.  Musculoskeletal:  Positive for back pain.     Physical Exam Triage Vital Signs ED Triage Vitals  Encounter Vitals Group     BP 04/24/24 1237 110/74     Girls Systolic BP Percentile --      Girls Diastolic BP Percentile --      Boys Systolic BP Percentile --       Boys Diastolic BP Percentile --      Pulse Rate 04/24/24 1237 69     Resp 04/24/24 1237 18     Temp 04/24/24 1237 98.4 F (36.9 C)     Temp Source 04/24/24 1237 Oral     SpO2 04/24/24 1237 98 %     Weight --      Height --      Head Circumference --      Peak Flow --      Pain Score 04/24/24 1235 9     Pain Loc --      Pain Education --      Exclude from Growth Chart --    No data found.  Updated Vital Signs BP 110/74   Pulse 69   Temp 98.4 F (36.9 C) (Oral)   Resp 18   LMP 03/26/2024   SpO2 98%   Breastfeeding No   Visual Acuity Right Eye Distance:   Left Eye Distance:   Bilateral Distance:    Right Eye Near:   Left Eye Near:    Bilateral Near:     Physical Exam Vitals and nursing note reviewed.  Constitutional:      General: She is not in acute distress.    Appearance: She is obese. She is not ill-appearing, toxic-appearing or diaphoretic.  HENT:     Head: Normocephalic and atraumatic.  Eyes:     Pupils: Pupils are equal, round, and reactive to light.  Cardiovascular:     Rate and Rhythm: Normal rate.  Pulmonary:     Effort: Pulmonary effort is normal.  Musculoskeletal:     Thoracic back: Normal.     Lumbar back: Tenderness present. No swelling, edema, deformity, signs of trauma, lacerations, spasms or bony tenderness. Normal range of motion. Negative right straight leg raise test and negative left straight leg raise test.       Back:     Comments: Strength is 5 out of 5 bilateral lower extremities  Skin:    General: Skin is warm and dry.  Neurological:     General: No focal deficit present.     Mental Status: She is alert and oriented to person, place, and time.  Psychiatric:        Mood and Affect: Mood normal.        Behavior: Behavior normal.      UC Treatments / Results  Labs (all labs ordered are listed, but only abnormal results are displayed) Labs Reviewed  POCT URINE DIPSTICK  POCT URINE  PREGNANCY  CERVICOVAGINAL ANCILLARY  ONLY    EKG   Radiology No results found.  Procedures Procedures (including critical care time)  Medications Ordered in UC Medications  ketorolac  (TORADOL ) 30 MG/ML injection 30 mg (30 mg Intramuscular Given 04/24/24 1306)    Initial Impression / Assessment and Plan / UC Course  I have reviewed the triage vital signs and the nursing notes.  Pertinent labs & imaging results that were available during my care of the patient were reviewed by me and considered in my medical decision making (see chart for details).     Reviewed exam and symptoms with patient.  No red flags.  UA negative for UTI and UPT negative.  Vaginal swab as ordered will contact for any positive results.  Will await results prior to initiating any treatment.  Declines blood work at this time.  Discussed musculoskeletal low back pain.  Patient given Toradol  injection in clinic.  Monitored for 10 minutes after injection with no reaction noted and tolerated well.  Instructed no NSAIDs for 24 hours and verbalized understanding.  Encouraged heat rest and PCP follow-up 2 to 3 days for recheck.  ER precautions reviewed Final Clinical Impressions(s) / UC Diagnoses   Final diagnoses:  Dysuria  Acute bilateral low back pain without sciatica  Screening examination for STD (sexually transmitted disease)  Vaginal discharge     Discharge Instructions      You were given a Toradol  injection in clinic today. Do not take any over the counter NSAID's such as Advil , ibuprofen , Aleve , or naproxen  for 24 hours. You may take tylenol  if needed.  You may start Robaxin twice daily as needed.  Please note this is a muscle relaxer will make you drowsy.  Do not drink alcohol or drive on this medication.  Heat to the low back and rest.  Follow-up with your PCP in 2 to 3 days for recheck.  Please go to the ER for any worsening symptoms.  Hope you feel better soon!     ED Prescriptions     Medication Sig Dispense Auth. Provider    methocarbamol (ROBAXIN) 750 MG tablet Take 1 tablet (750 mg total) by mouth 2 (two) times daily as needed for muscle spasms. 10 tablet [place], Jodi R, NP      PDMP not reviewed this encounter.    Loreda Myla SAUNDERS, NP 04/24/24 1307     [1]  Social History Tobacco Use   Smoking status: Never   Smokeless tobacco: Never  Vaping Use   Vaping status: Never Used  Substance Use Topics   Alcohol use: Never   Drug use: Never     Loreda Myla SAUNDERS, NP 04/24/24 1328

## 2024-04-27 LAB — CERVICOVAGINAL ANCILLARY ONLY
Bacterial Vaginitis (gardnerella): NEGATIVE
Candida Glabrata: NEGATIVE
Candida Vaginitis: NEGATIVE
Chlamydia: NEGATIVE
Comment: NEGATIVE
Comment: NEGATIVE
Comment: NEGATIVE
Comment: NEGATIVE
Comment: NEGATIVE
Comment: NORMAL
Neisseria Gonorrhea: NEGATIVE
Trichomonas: NEGATIVE

## 2024-05-16 ENCOUNTER — Ambulatory Visit
Admission: EM | Admit: 2024-05-16 | Discharge: 2024-05-16 | Disposition: A | Discharge status: Home / Self Care | Attending: Family Medicine | Admitting: Family Medicine

## 2024-05-16 DIAGNOSIS — M79645 Pain in left finger(s): Secondary | ICD-10-CM | POA: Diagnosis not present

## 2024-05-16 DIAGNOSIS — S61112A Laceration without foreign body of left thumb with damage to nail, initial encounter: Secondary | ICD-10-CM

## 2024-05-16 NOTE — ED Provider Notes (Signed)
 " [facility]  Note:  This document was prepared using Conservation officer, historic buildings and may include unintentional dictation errors.  MRN: [id number] DOB: December 01, 2000  Subjective:   Colleen Castillo is a 24 y.o. female presenting for wound check 3-day history of a left thumb laceration.  This was sustained from a knife at home.  Denies fever, drainage of pus, redness, swelling.  Has occasional random intermittent pains when she bumps her thumb against anything.  Has kept it clean, dry, covered.  Last Tdap was updated within the past 10 years and does not want it updated today.  Current Outpatient Medications  Medication Instructions   cholecalciferol (VITAMIN D3) 1,000 Units, Oral, Daily   ibuprofen  (ADVIL ) 600 mg, Oral, Every 6 hours PRN   lisdexamfetamine  (VYVANSE ) 30 mg, Oral, Daily   methocarbamol  (ROBAXIN ) 750 mg, Oral, 2 times daily PRN   oxyCODONE  (OXY IR/ROXICODONE ) 5-10 mg, Oral, Every 6 hours PRN   Prenatal Vit-Fe Fumarate-FA (PRENATAL MULTIVITAMIN) TABS tablet 1 tablet, Oral, Daily   Vitamin D  (Ergocalciferol ) (DRISDOL ) 50,000 Units, Oral, Weekly   Vyvanse  30 mg, Oral, Daily   Wegovy  0.5 mg, Subcutaneous, Weekly   Wegovy  0.5 mg, Subcutaneous, Weekly   Wegovy  0.5 mg, Subcutaneous, Weekly   Wegovy  1 mg, Subcutaneous, Weekly    Allergies[1]  Past Medical History:  Diagnosis Date   Asthma      Past Surgical History:  Procedure Laterality Date   CESAREAN SECTION N/A 08/15/2022   Procedure: CESAREAN SECTION;  Surgeon: Gloriann Chick, MD;  Location: [facility];  Service: Obstetrics;  Laterality: N/A;   NO PAST SURGERIES      Family History  Problem Relation Age of Onset   Thyroid disease Mother    Healthy Father    Breast cancer Paternal Grandmother     Social History   Occupational History   Not on file  Tobacco Use   Smoking status: Never   Smokeless tobacco: Never  Vaping Use   Vaping status: Never Used  Substance and Sexual Activity    Alcohol use: Never   Drug use: Never   Sexual activity: Not Currently    Birth control/protection: None     ROS   Objective:   Vitals: BP 113/69 (BP Location: Left Arm)   Pulse 70   Temp 98 F (36.7 C) (Oral)   Resp 16   LMP 04/29/2024 (Approximate)   SpO2 96%   Breastfeeding No   Physical Exam Constitutional:      General: She is not in acute distress.    Appearance: Normal appearance. She is well-developed. She is not ill-appearing, toxic-appearing or diaphoretic.  HENT:     Head: Normocephalic and atraumatic.     Nose: Nose normal.     Mouth/Throat:     Mouth: Mucous membranes are moist.  Eyes:     General: No scleral icterus.       Right eye: No discharge.        Left eye: No discharge.     Extraocular Movements: Extraocular movements intact.  Cardiovascular:     Rate and Rhythm: Normal rate.  Pulmonary:     Effort: Pulmonary effort is normal.  Musculoskeletal:       Hands:  Skin:    General: Skin is warm and dry.  Neurological:     General: No focal deficit present.     Mental Status: She is alert and oriented to person, place, and time.  Psychiatric:  Mood and Affect: Mood normal.        Behavior: Behavior normal.     Assessment and Plan :   PDMP not reviewed this encounter.  1. Pain of left thumb   2. Laceration of left thumb without foreign body with damage to nail, initial encounter      No evidence of wound infection.  Wound must heal by secondary intention.  Wound care reviewed.    [1] No Known Allergies    Christopher Savannah, PA-C 05/16/24 1536  "

## 2024-05-16 NOTE — ED Triage Notes (Addendum)
 Pt reports she cut the left thumb 3 days ago with at knife at home. Pt ca not remmeebr the last Tdap.

## 2024-05-16 NOTE — Discharge Instructions (Signed)
 Keep the wound clean and dry. Use dressings if you will be active your hands. Expose the wound to air when you can keep it dry, clean and without much risk for injury. If you develop fever, drainage of pus, redness, bleeding that does not stop with pressure dressing then return to our clinic for a recheck.

## 2024-05-21 ENCOUNTER — Encounter (HOSPITAL_BASED_OUTPATIENT_CLINIC_OR_DEPARTMENT_OTHER): Payer: Self-pay

## 2024-05-21 DIAGNOSIS — R1030 Lower abdominal pain, unspecified: Secondary | ICD-10-CM | POA: Diagnosis present

## 2024-05-21 DIAGNOSIS — J45909 Unspecified asthma, uncomplicated: Secondary | ICD-10-CM | POA: Insufficient documentation

## 2024-05-21 NOTE — ED Triage Notes (Addendum)
 Pt states she developed suprapubic pain behind my c-section scar last night No other symptoms

## 2024-05-22 ENCOUNTER — Emergency Department (HOSPITAL_BASED_OUTPATIENT_CLINIC_OR_DEPARTMENT_OTHER)
Admission: EM | Admit: 2024-05-22 | Discharge: 2024-05-22 | Disposition: A | Discharge status: Home / Self Care | Attending: Emergency Medicine | Admitting: Emergency Medicine

## 2024-05-22 DIAGNOSIS — R1030 Lower abdominal pain, unspecified: Secondary | ICD-10-CM

## 2024-05-22 LAB — COMPREHENSIVE METABOLIC PANEL WITH GFR
ALT: 8 U/L (ref 0–44)
AST: 12 U/L — ABNORMAL LOW (ref 15–41)
Albumin: 4 g/dL (ref 3.5–5.0)
Alkaline Phosphatase: 69 U/L (ref 38–126)
Anion gap: 10 (ref 5–15)
BUN: 16 mg/dL (ref 6–20)
CO2: 25 mmol/L (ref 22–32)
Calcium: 9.4 mg/dL (ref 8.9–10.3)
Chloride: 104 mmol/L (ref 98–111)
Creatinine, Ser: 0.71 mg/dL (ref 0.44–1.00)
GFR, Estimated: >60 mL/min
Glucose, Bld: 96 mg/dL (ref 70–99)
Potassium: 4.1 mmol/L (ref 3.5–5.1)
Sodium: 139 mmol/L (ref 135–145)
Total Bilirubin: 0.2 mg/dL (ref 0.0–1.2)
Total Protein: 6.7 g/dL (ref 6.5–8.1)

## 2024-05-22 LAB — CBC WITH DIFFERENTIAL/PLATELET
Abs Immature Granulocytes: 0.01 K/uL (ref 0.00–0.07)
Basophils Absolute: 0 K/uL (ref 0.0–0.1)
Basophils Relative: 0 %
Eosinophils Absolute: 0.1 K/uL (ref 0.0–0.5)
Eosinophils Relative: 1 %
HCT: 37.2 % (ref 36.0–46.0)
Hemoglobin: 11.9 g/dL — ABNORMAL LOW (ref 12.0–15.0)
Immature Granulocytes: 0 %
Lymphocytes Relative: 37 %
Lymphs Abs: 1.9 K/uL (ref 0.7–4.0)
MCH: 28.8 pg (ref 26.0–34.0)
MCHC: 32 g/dL (ref 30.0–36.0)
MCV: 90.1 fL (ref 80.0–100.0)
Monocytes Absolute: 0.7 K/uL (ref 0.1–1.0)
Monocytes Relative: 14 %
Neutro Abs: 2.4 K/uL (ref 1.7–7.7)
Neutrophils Relative %: 48 %
Platelets: 233 K/uL (ref 150–400)
RBC: 4.13 MIL/uL (ref 3.87–5.11)
RDW: 11.9 % (ref 11.5–15.5)
WBC: 5.1 K/uL (ref 4.0–10.5)
nRBC: 0 % (ref 0.0–0.2)

## 2024-05-22 LAB — URINALYSIS, ROUTINE W REFLEX MICROSCOPIC
Bilirubin Urine: NEGATIVE
Glucose, UA: NEGATIVE mg/dL
Hgb urine dipstick: NEGATIVE
Ketones, ur: NEGATIVE mg/dL
Leukocytes,Ua: NEGATIVE
Nitrite: NEGATIVE
Protein, ur: NEGATIVE mg/dL
Specific Gravity, Urine: 1.025 (ref 1.005–1.030)
pH: 5.5 (ref 5.0–8.0)

## 2024-05-22 LAB — PREGNANCY, URINE: Preg Test, Ur: NEGATIVE

## 2024-05-22 LAB — LIPASE, BLOOD: Lipase: 31 U/L (ref 11–51)

## 2024-05-22 MED ORDER — HYOSCYAMINE SULFATE 0.125 MG SL SUBL: 0.2500 mg | SUBLINGUAL_TABLET | Freq: Once | SUBLINGUAL | Status: DC

## 2024-05-22 MED ORDER — ALUM & MAG HYDROXIDE-SIMETH 200-200-20 MG/5ML PO SUSP
30.0000 mL | Freq: Once | ORAL | Status: AC
  Administered 2024-05-22: 30 mL via ORAL
  Filled 2024-05-22: qty 30

## 2024-05-22 NOTE — ED Notes (Signed)
 Lab called to add a lipase

## 2024-05-22 NOTE — ED Notes (Signed)
 Pt still unable to void

## 2024-05-22 NOTE — ED Provider Notes (Signed)
 " [place] EMERGENCY DEPARTMENT AT [facility] Provider Note  CSN: [id number] Arrival date & time: 05/21/24 2244  Chief Complaint(s) Abdominal Pain  History provided by patient. HPI & MDM Colleen Castillo is a 24 y.o. female LMP 12/17.   Abdominal Pain Pain location:  Suprapubic Pain quality: cramping   Pain radiates to:  Does not radiate Pain severity:  Severe Onset quality:  Sudden Duration:  7 hours Timing:  Constant Progression:  Waxing and waning Relieved by:  Nothing Worsened by:  Movement and palpation Associated symptoms: nausea   Associated symptoms: no constipation, no diarrhea, no dysuria, no fever and no vomiting    Last sexual relation on 12/4. Tested for STI and cleared on 12/12.   Medical Decision Making Amount and/or Complexity of Data Reviewed Labs: ordered. Decision-making details documented in ED Course.  Risk OTC drugs. Prescription drug management.    Differential diagnosis considered.  Workup below. CBC without leukocytosis.  Mild anemia. CMP without significant electrolyte derangements or renal sufficiency.  No evidence of bili obstruction or pancreatitis. UPT negative ruling out pregnancy related process. UA negative for infection. Given recent negative STD evaluation by OB after last sexual intercourse, unlikely STI at this time. Possible intestinal colic. Also considered ovarian torsion and discussed need for ultrasound however patient did not want to stay for ultrasound in the morning or be transferred to [facility].  Final Clinical Impression(s) / ED Diagnoses Final diagnoses:  Lower abdominal pain   The patient appears reasonably screened and/or stabilized for discharge and I doubt any other medical condition or other [facility] requiring further screening, evaluation, or treatment in the ED at this time. I have discussed the findings, Dx and Tx plan with the patient/family who expressed understanding and agree(s) with the plan. Discharge  instructions discussed at length. The patient/family was given strict return precautions who verbalized understanding of the instructions. No further questions at time of discharge.  Disposition: Discharge  Condition: Good  ED Discharge Orders     None         Follow Up: Center, [facility] [address] [place] [address] [phone]  Call  to schedule an appointment for close follow up     Past Medical History Past Medical History:  Diagnosis Date   Asthma    Patient Active Problem List   Diagnosis Date Noted   Encounter for induction of labor 08/14/2022   Asthma    Home Medication(s) Prior to Admission medications  Medication Sig Start Date End Date Taking? Authorizing Provider  cholecalciferol (VITAMIN D3) 25 MCG (1000 UNIT) tablet Take 1,000 Units by mouth daily.    [provider]  ergocalciferol  (VITAMIN D2) 1.25 MG (50000 UT) capsule Take 1 capsule (50,000 Units total) by mouth once a week. 07/16/23   Elizbeth Leita Ruth, FNP  ibuprofen  (ADVIL ) 600 MG tablet Take 1 tablet (600 mg total) by mouth every 6 (six) hours as needed for mild pain, moderate pain or cramping. 08/17/22   Henry Slough, MD  lisdexamfetamine  (VYVANSE ) 30 MG capsule Take 1 capsule (30 mg total) by mouth daily. 05/01/23     lisdexamfetamine  (VYVANSE ) 30 MG capsule Take 1 capsule (30 mg total) by mouth daily. 06/20/23     methocarbamol  (ROBAXIN ) 750 MG tablet Take 1 tablet (750 mg total) by mouth 2 (two) times daily as needed for muscle spasms. 04/24/24   Mayer, Jodi R, NP  oxyCODONE  (OXY IR/ROXICODONE ) 5 MG immediate release tablet Take 1-2 tablets (5-10 mg total)  by mouth every 6 (six) hours as needed for moderate pain, severe pain or breakthrough pain. 08/17/22   Henry Slough, MD  Prenatal Vit-Fe Fumarate-FA (PRENATAL MULTIVITAMIN) TABS tablet Take 1 tablet by mouth daily at 12 noon.    [provider]  Semaglutide -Weight Management (WEGOVY ) 0.5 MG/0.5ML SOAJ  Inject 0.5 mg into the skin once a week. 05/01/23     Semaglutide -Weight Management (WEGOVY ) 0.5 MG/0.5ML SOAJ Inject 0.5 mg into the skin once a week. 06/20/23     Semaglutide -Weight Management (WEGOVY ) 0.5 MG/0.5ML SOAJ Inject 0.5 mg into the skin once a week. 07/16/23     Semaglutide -Weight Management (WEGOVY ) 1 MG/0.5ML SOAJ Inject 1 mg into the skin once a week. 08/21/23                                                                                                                                       Allergies Patient has no known allergies.  Review of Systems Review of Systems  Constitutional:  Negative for fever.  Gastrointestinal:  Positive for abdominal pain and nausea. Negative for constipation, diarrhea and vomiting.  Genitourinary:  Negative for dysuria.   As noted in HPI  Physical Exam Vital Signs  I have reviewed the triage vital signs BP (!) 105/45 (BP Location: Right Arm)   Pulse 75   Temp 98.2 F (36.8 C) (Oral)   Resp 18   Ht 5' 9 (1.753 m)   Wt 108.4 kg   LMP 04/29/2024 (Exact Date)   SpO2 99%   BMI 35.29 kg/m   Physical Exam Vitals reviewed.  Constitutional:      General: She is not in acute distress.    Appearance: She is well-developed. She is not diaphoretic.  HENT:     Head: Normocephalic and atraumatic.     Right Ear: External ear normal.     Left Ear: External ear normal.     Nose: Nose normal.  Eyes:     General: No scleral icterus.    Conjunctiva/sclera: Conjunctivae normal.  Neck:     Trachea: Phonation normal.  Cardiovascular:     Rate and Rhythm: Normal rate and regular rhythm.  Pulmonary:     Effort: Pulmonary effort is normal. No respiratory distress.     Breath sounds: No stridor.  Abdominal:     General: There is no distension.     Tenderness: There is abdominal tenderness (mild) in the suprapubic area. There is no guarding or rebound.  Musculoskeletal:        General: Normal range of motion.     Cervical back: Normal range of  motion.  Neurological:     Mental Status: She is alert and oriented to person, place, and time.  Psychiatric:        Behavior: Behavior normal.     ED Results and Treatments Labs (all labs ordered are listed, but only abnormal results are displayed) Labs  Reviewed  CBC WITH DIFFERENTIAL/PLATELET - Abnormal; Notable for the following components:      Result Value   Hemoglobin 11.9 (*)    All other components within normal limits  COMPREHENSIVE METABOLIC PANEL WITH GFR - Abnormal; Notable for the following components:   AST 12 (*)    All other components within normal limits  URINALYSIS, ROUTINE W REFLEX MICROSCOPIC  PREGNANCY, URINE  LIPASE, BLOOD                                                                                                                         EKG  EKG Interpretation Date/Time:    Ventricular Rate:    PR Interval:    QRS Duration:    QT Interval:    QTC Calculation:   R Axis:      Text Interpretation:         Radiology No results found.  Medications Ordered in ED Medications  hyoscyamine  (LEVSIN  SL) SL tablet 0.25 mg (0.25 mg Sublingual Not Given 05/22/24 0351)  alum & mag hydroxide-simeth (MAALOX/MYLANTA) 200-200-20 MG/5ML suspension 30 mL (30 mLs Oral Given 05/22/24 0351)   Procedures Procedures  (including critical care time)   This chart was dictated using voice recognition software.  Despite best efforts to proofread,  errors can occur which can change the documentation meaning.   Trine Raynell Moder, MD 05/22/24 [phone]  "

## 2024-05-26 ENCOUNTER — Other Ambulatory Visit: Payer: Self-pay | Admitting: Surgery

## 2024-05-26 DIAGNOSIS — R1084 Generalized abdominal pain: Secondary | ICD-10-CM

## 2024-06-02 ENCOUNTER — Inpatient Hospital Stay: Admission: RE | Admit: 2024-06-02 | Source: Ambulatory Visit

## 2024-06-08 ENCOUNTER — Ambulatory Visit: Admitting: Physician Assistant

## 2024-06-17 ENCOUNTER — Other Ambulatory Visit

## 2024-06-18 ENCOUNTER — Ambulatory Visit: Admitting: Physician Assistant

## 2024-06-18 VITALS — BP 103/68

## 2024-06-18 DIAGNOSIS — L810 Postinflammatory hyperpigmentation: Secondary | ICD-10-CM

## 2024-06-18 DIAGNOSIS — R21 Rash and other nonspecific skin eruption: Secondary | ICD-10-CM

## 2024-06-18 DIAGNOSIS — L700 Acne vulgaris: Secondary | ICD-10-CM

## 2024-06-18 MED ORDER — TRIAMCINOLONE ACETONIDE 0.1 % EX CREA: 1.0000 | TOPICAL_CREAM | Freq: Two times a day (BID) | CUTANEOUS | 0 refills | Status: DC | PRN

## 2024-06-18 MED ORDER — DOXYCYCLINE HYCLATE 100 MG PO TABS: 100.0000 mg | ORAL_TABLET | Freq: Every day | ORAL | 0 refills | Status: DC

## 2024-06-18 MED ORDER — TRETINOIN 0.025 % EX CREA: TOPICAL_CREAM | Freq: Every day | CUTANEOUS | 4 refills | Status: AC

## 2024-06-18 MED ORDER — DOXYCYCLINE HYCLATE 100 MG PO TABS: 100.0000 mg | ORAL_TABLET | Freq: Every day | ORAL | 0 refills | Status: AC

## 2024-06-18 MED ORDER — TRIAMCINOLONE ACETONIDE 0.1 % EX CREA: 1.0000 | TOPICAL_CREAM | Freq: Two times a day (BID) | CUTANEOUS | 0 refills | Status: AC | PRN

## 2024-06-18 NOTE — Patient Instructions (Signed)

## 2024-06-18 NOTE — Progress Notes (Unsigned)
" ° °  New Patient Visit   Subjective   Colleen Castillo is a 24 y.o. female NEW PATIENT who presents for the following: recurrent rash on located on upper inner thighs.   Pt reports small spotted areas occur occasionally on legs endorsing itchiness and redness. Pt has used triamcinolone  cream in the past and was not effective. Pt uses bar dove soap as a body wash, panoxyl facial soap and ordinary acne treatment set which includes cleanser and moisturizer. Pt uses EOS lotion all over body. Pt endorses the following concerns: acne across face and possible eczema on right side of mouth with raised, rash like appearence. Pt has seen a dermatologist in the past. Pt denies personal history of skin cancer.   The following portions of the chart were reviewed this encounter and updated as appropriate: medications, allergies, medical history  Review of Systems:  No other skin or systemic complaints except as noted in HPI or Assessment and Plan.  Objective  Well appearing patient in no apparent distress; mood and affect are within normal limits.  A focused examination was performed of the following areas: Upper inner thighs  Relevant exam findings are noted in the Assessment and Plan.    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   Return in about 4 months (around 10/16/2024) for Acne f/u.  LILLETTE Virgle Boards, Dermatology [employer] am acting as a neurosurgeon for Yachet Mattson K, PA-C.  Documentation: I have reviewed the above documentation for accuracy and completeness, and I agree with the  above.  Lashun Mccants K, PA-C  "

## 2024-06-19 ENCOUNTER — Encounter: Payer: Self-pay | Admitting: Physician Assistant

## 2024-06-26 ENCOUNTER — Other Ambulatory Visit

## 2024-10-19 ENCOUNTER — Ambulatory Visit: Admitting: Physician Assistant
